# Patient Record
Sex: Female | Born: 1970
Health system: Southern US, Community
[De-identification: ages and names within clinical notes are randomized; demographics above are authoritative.]

## PROBLEM LIST (undated history)

## (undated) DIAGNOSIS — I10 Essential (primary) hypertension: Secondary | ICD-10-CM

## (undated) DIAGNOSIS — E119 Type 2 diabetes mellitus without complications: Secondary | ICD-10-CM

## (undated) DIAGNOSIS — E785 Hyperlipidemia, unspecified: Secondary | ICD-10-CM

## (undated) DIAGNOSIS — Z789 Other specified health status: Secondary | ICD-10-CM

## (undated) HISTORY — DX: Hyperlipidemia, unspecified: E78.5

## (undated) HISTORY — DX: Type 2 diabetes mellitus without complications: E11.9

## (undated) HISTORY — DX: Other specified health status: Z78.9

## (undated) HISTORY — DX: Essential (primary) hypertension: I10

## (undated) HISTORY — PX: NO PAST SURGERIES: SHX2092

---

## 2003-07-15 ENCOUNTER — Inpatient Hospital Stay (HOSPITAL_COMMUNITY): Admission: AD | Admit: 2003-07-15 | Discharge: 2003-07-16 | Payer: Self-pay | Admitting: Family Medicine

## 2003-07-16 ENCOUNTER — Encounter: Payer: Self-pay | Admitting: Family Medicine

## 2003-07-19 ENCOUNTER — Inpatient Hospital Stay (HOSPITAL_COMMUNITY): Admission: AD | Admit: 2003-07-19 | Discharge: 2003-07-19 | Payer: Self-pay | Admitting: Obstetrics and Gynecology

## 2004-05-24 ENCOUNTER — Other Ambulatory Visit: Admission: RE | Admit: 2004-05-24 | Discharge: 2004-05-24 | Payer: Self-pay | Admitting: Obstetrics and Gynecology

## 2004-06-28 ENCOUNTER — Ambulatory Visit (HOSPITAL_COMMUNITY): Admission: RE | Admit: 2004-06-28 | Discharge: 2004-06-28 | Payer: Self-pay | Admitting: Obstetrics and Gynecology

## 2005-03-21 ENCOUNTER — Ambulatory Visit: Payer: Self-pay | Admitting: *Deleted

## 2005-03-21 ENCOUNTER — Ambulatory Visit: Payer: Self-pay | Admitting: Nurse Practitioner

## 2005-06-07 ENCOUNTER — Ambulatory Visit: Payer: Self-pay | Admitting: Nurse Practitioner

## 2005-06-20 ENCOUNTER — Ambulatory Visit: Payer: Self-pay | Admitting: Nurse Practitioner

## 2005-06-26 ENCOUNTER — Ambulatory Visit (HOSPITAL_COMMUNITY): Admission: RE | Admit: 2005-06-26 | Discharge: 2005-06-26 | Payer: Self-pay | Admitting: Internal Medicine

## 2005-08-10 ENCOUNTER — Ambulatory Visit: Payer: Self-pay | Admitting: Family Medicine

## 2005-09-27 ENCOUNTER — Ambulatory Visit: Payer: Self-pay | Admitting: Nurse Practitioner

## 2005-11-13 ENCOUNTER — Ambulatory Visit: Payer: Self-pay | Admitting: Family Medicine

## 2006-11-25 ENCOUNTER — Ambulatory Visit (HOSPITAL_COMMUNITY): Admission: RE | Admit: 2006-11-25 | Discharge: 2006-11-25 | Payer: Self-pay | Admitting: Obstetrics & Gynecology

## 2012-06-01 ENCOUNTER — Ambulatory Visit: Payer: Self-pay | Admitting: Family Medicine

## 2012-06-01 VITALS — BP 131/76 | HR 114 | Temp 103.0°F | Resp 18 | Ht 66.5 in | Wt 148.0 lb

## 2012-06-01 DIAGNOSIS — R300 Dysuria: Secondary | ICD-10-CM

## 2012-06-01 DIAGNOSIS — R509 Fever, unspecified: Secondary | ICD-10-CM

## 2012-06-01 DIAGNOSIS — R112 Nausea with vomiting, unspecified: Secondary | ICD-10-CM

## 2012-06-01 DIAGNOSIS — R159 Full incontinence of feces: Secondary | ICD-10-CM

## 2012-06-01 DIAGNOSIS — N390 Urinary tract infection, site not specified: Secondary | ICD-10-CM

## 2012-06-01 LAB — POCT URINALYSIS DIPSTICK
Glucose, UA: NEGATIVE
Nitrite, UA: POSITIVE
Protein, UA: 100
Spec Grav, UA: 1.02
Urobilinogen, UA: 4
pH, UA: 6

## 2012-06-01 LAB — POCT CBC
Granulocyte percent: 75.5 % (ref 37–80)
HCT, POC: 40.9 % (ref 37.7–47.9)
Hemoglobin: 13.1 g/dL (ref 12.2–16.2)
Lymph, poc: 1.9 (ref 0.6–3.4)
MCH, POC: 26.7 pg — AB (ref 27–31.2)
MCHC: 32 g/dL (ref 31.8–35.4)
MCV: 83.4 fL (ref 80–97)
MID (cbc): 0.7 (ref 0–0.9)
MPV: 8.8 fL (ref 0–99.8)
POC Granulocyte: 7.9 — AB (ref 2–6.9)
POC LYMPH PERCENT: 18 %L (ref 10–50)
POC MID %: 6.5 %M (ref 0–12)
Platelet Count, POC: 251 10*3/uL (ref 142–424)
RBC: 4.9 M/uL (ref 4.04–5.48)
RDW, POC: 14.7 %
WBC: 10.4 10*3/uL — AB (ref 4.6–10.2)

## 2012-06-01 LAB — POCT UA - MICROSCOPIC ONLY
Casts, Ur, LPF, POC: NEGATIVE
Crystals, Ur, HPF, POC: NEGATIVE
Mucus, UA: NEGATIVE
Yeast, UA: NEGATIVE

## 2012-06-01 MED ORDER — PROMETHAZINE HCL 12.5 MG PO TABS: 12.5000 mg | ORAL_TABLET | Freq: Three times a day (TID) | ORAL | Status: DC | PRN

## 2012-06-01 MED ORDER — CIPROFLOXACIN HCL 500 MG PO TABS: 500.0000 mg | ORAL_TABLET | Freq: Two times a day (BID) | ORAL | Status: AC

## 2012-06-01 MED ORDER — CEFTRIAXONE SODIUM 1 G IJ SOLR
1.0000 g | Freq: Once | INTRAMUSCULAR | Status: AC
  Administered 2012-06-01: 1 g via INTRAMUSCULAR

## 2012-06-01 NOTE — Progress Notes (Signed)
[facility]:  Office Visit  Chief Complaint:  Chief Complaint  Patient presents with  . Fever    2 days  . Flank Pain    2 days     HPI: Crystal Doyle is a 41 y.o. female who complains of :  Right sided abdominal and flank pain with fever, nausea, and  Dysuria. + vomitus x 3. Denies hematuria. Has not tried any medications. No relieving factors.   History reviewed. No pertinent past medical history. History reviewed. No pertinent past surgical history. History   Social History  . Marital Status: Married    Spouse Name: N/A    Number of Children: N/A  . Years of Education: N/A   Social History Main Topics  . Smoking status: Never Smoker   . Smokeless tobacco: None  . Alcohol Use: No  . Drug Use: No  . Sexually Active: None   Other Topics Concern  . None   Social History Narrative  . None   Family History  Problem Relation Age of Onset  . Kidney disease Neg Hx    No Known Allergies Prior to Admission medications   Not on File     ROS: The patient denies night sweats, unintentional weight loss, chest pain, palpitations, wheezing, dyspnea on exertion,  hematuria, melena, numbness, weakness, or tingling.  +fevers, chills,  nausea, vomiting, abdominal pain, dysuria,  All other systems have been reviewed and were otherwise negative with the exception of those mentioned in the HPI and as above.    PHYSICAL EXAM: Filed Vitals:   06/01/12 1635  BP: 131/76  Pulse: 114  Temp: 103 F (39.4 C)  Resp: 18   Filed Vitals:   06/01/12 1635  Height: 5' 6.5" (1.689 m)  Weight: 148 lb (67.132 kg)   Body mass index is 23.53 kg/(m^2).  General: Alert, mild  acute distress HEENT:  Normocephalic, atraumatic, oropharynx patent.  Cardiovascular:  Regular rate and rhythm, no rubs murmurs or gallops.  No Carotid bruits, radial pulse intact. No pedal edema.  Respiratory: Clear to auscultation bilaterally.  No wheezes, rales, or rhonchi.  No  cyanosis, no use of accessory musculature GI: No organomegaly, abdomen is soft and non-tender, positive bowel sounds.  No masses. Skin: No rashes. Neurologic: Facial musculature symmetric. Psychiatric: Patient is appropriate throughout our interaction. Lymphatic: No cervical lymphadenopathy Musculoskeletal: Gait intact.+ right sided CVA tenderness   LABS: Results for orders placed in visit on 06/01/12  POCT CBC      Component Value Range   WBC 10.4 (*) 4.6 - 10.2 K/uL   Lymph, poc 1.9  0.6 - 3.4   POC LYMPH PERCENT 18.0  10 - 50 %L   MID (cbc) 0.7  0 - 0.9   POC MID % 6.5  0 - 12 %M   POC Granulocyte 7.9 (*) 2 - 6.9   Granulocyte percent 75.5  37 - 80 %G   RBC 4.90  4.04 - 5.48 M/uL   Hemoglobin 13.1  12.2 - 16.2 g/dL   HCT, POC 16.1  09.6 - 47.9 %   MCV 83.4  80 - 97 fL   MCH, POC 26.7 (*) 27 - 31.2 pg   MCHC 32.0  31.8 - 35.4 g/dL   RDW, POC 04.5     Platelet Count, POC 251  142 - 424 K/uL   MPV 8.8  0 - 99.8 fL  POCT UA - MICROSCOPIC ONLY  Component Value Range   WBC, Ur, HPF, POC TNTC     RBC, urine, microscopic 5-10     Bacteria, U Microscopic 1+ bacilli     Mucus, UA neg     Epithelial cells, urine per micros 0-2     Crystals, Ur, HPF, POC neg     Casts, Ur, LPF, POC neg     Yeast, UA neg    POCT URINALYSIS DIPSTICK      Component Value Range   Color, UA amber     Clarity, UA cloudy     Glucose, UA neg     Bilirubin, UA small     Ketones, UA trace     Spec Grav, UA 1.020     Blood, UA mod     pH, UA 6.0     Protein, UA 100     Urobilinogen, UA 4.0     Nitrite, UA pos     Leukocytes, UA moderate (2+)       EKG/XRAY:   Primary read interpreted by Dr. Conley Rolls at [facility].   ASSESSMENT/PLAN: Encounter Diagnoses  Name Primary?  . Fever Yes  . Dysuria   . Nausea & vomiting   . Urinary tract infection, site not specified     UTI/Pyelonephritis-rx Rocephin 1 gram Im x 1, Cipro 500 mg PO BID x 10 days Nausea-Promethazine prn Fever- Tylenol prn Push  fluids at home F/u prn if no improvement in next 48-72 hrs.    LE, THAO PHUONG, DO 06/01/2012 5:11 PM

## 2012-06-03 LAB — URINE CULTURE: Colony Count: >=100000

## 2013-11-14 ENCOUNTER — Ambulatory Visit: Payer: Self-pay | Admitting: Family Medicine

## 2013-11-14 DIAGNOSIS — J069 Acute upper respiratory infection, unspecified: Secondary | ICD-10-CM

## 2013-11-14 DIAGNOSIS — Z23 Encounter for immunization: Secondary | ICD-10-CM

## 2013-11-14 MED ORDER — INFLUENZA VAC SPLIT QUAD 0.5 ML IM SUSP: 0.5000 mL | INTRAMUSCULAR | Status: DC

## 2013-11-14 MED ORDER — HYDROCODONE-HOMATROPINE 5-1.5 MG/5ML PO SYRP: 5.0000 mL | ORAL_SOLUTION | Freq: Three times a day (TID) | ORAL | Status: DC | PRN

## 2013-11-14 MED ORDER — AZITHROMYCIN 250 MG PO TABS: ORAL_TABLET | ORAL | Status: DC

## 2013-11-14 NOTE — Progress Notes (Signed)
    This chart was scribed for Elvina Sidle, MD by Arlan Organ, ED Scribe. This patient was seen in room Room 11d and the patient's care was started 12:34 PM.   Patient Name: Crystal Doyle Date of Birth: 01-28-1971 Medical Record Number: [id number] Gender: female Date of Encounter: 11/14/2013  Chief Complaint: Dizziness and Fever  History of Present Illness:  Crystal Doyle is a 42 y.o. very pleasant female patient who presents with the following:  Pt presents to [facility] c/o of gradual onset, unchanged dizziness that initially started 2 days ago. She also lists nasal congestion, mild cough, and fever as associated symptoms. Pt states her fever has now resolved. Pt has tried OTC Tylenol and Nyquil with no relief. Her husband states she was at work the other day, and says they left the door open to the facility allowing cold air to come in. She denies any other pertinent medical conditions.  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  History reviewed. No pertinent past medical history. History reviewed. No pertinent past surgical history. History  Substance Use Topics  . Smoking status: Never Smoker   . Smokeless tobacco: Not on file  . Alcohol Use: No   Family History  Problem Relation Age of Onset  . Kidney disease Neg Hx    No Known Allergies  Medication list has been reviewed and updated.  Current Outpatient Prescriptions on File Prior to Visit  Medication Sig Dispense Refill  . promethazine (PHENERGAN) 12.5 MG tablet Take 1 tablet (12.5 mg total) by mouth every 8 (eight) hours as needed for nausea.  20 tablet  0   No current facility-administered medications on file prior to visit.    Review of Systems:   Physical Examination: There were no vitals filed for this visit. @vitals2 @ There is no weight on file to calculate BMI. Ideal Body Weight: @FLOWAMB ([id number])@ HEENT: Mucopurulent discharge in  nose No acute distress Neck: Supple no adenopathy Chest: Clear Heart: Regular no murmur Skin no rash   EKG / Labs / Xrays: None available at time of encounter  Assessment and Plan:  URI (upper respiratory infection) - Plan: azithromycin (ZITHROMAX Z-PAK) 250 MG tablet, HYDROcodone-homatropine (HYCODAN) 5-1.5 MG/5ML syrup  Need for prophylactic vaccination and inoculation against influenza - Plan: influenza vac split quadrivalent PF (FLUARIX) injection 0.5 mL  Signed, Elvina Sidle, MD

## 2013-11-14 NOTE — Patient Instructions (Signed)

## 2014-02-06 ENCOUNTER — Ambulatory Visit (INDEPENDENT_AMBULATORY_CARE_PROVIDER_SITE_OTHER): Payer: PRIVATE HEALTH INSURANCE | Admitting: Physician Assistant

## 2014-02-06 VITALS — BP 112/60 | HR 76 | Temp 98.6°F | Resp 18 | Ht 66.5 in | Wt 149.0 lb

## 2014-02-06 DIAGNOSIS — J329 Chronic sinusitis, unspecified: Secondary | ICD-10-CM

## 2014-02-06 DIAGNOSIS — N926 Irregular menstruation, unspecified: Secondary | ICD-10-CM

## 2014-02-06 DIAGNOSIS — R05 Cough: Secondary | ICD-10-CM

## 2014-02-06 DIAGNOSIS — R059 Cough, unspecified: Secondary | ICD-10-CM

## 2014-02-06 MED ORDER — AMOXICILLIN 875 MG PO TABS: 1750.0000 mg | ORAL_TABLET | Freq: Two times a day (BID) | ORAL | Status: DC

## 2014-02-06 MED ORDER — HYDROCOD POLST-CHLORPHEN POLST 10-8 MG/5ML PO LQCR: 5.0000 mL | Freq: Two times a day (BID) | ORAL | Status: DC | PRN

## 2014-02-06 MED ORDER — IPRATROPIUM BROMIDE 0.03 % NA SOLN: 2.0000 | Freq: Two times a day (BID) | NASAL | Status: DC

## 2014-02-06 MED ORDER — GUAIFENESIN ER 1200 MG PO TB12: 1.0000 | ORAL_TABLET | Freq: Two times a day (BID) | ORAL | Status: DC | PRN

## 2014-02-06 NOTE — Patient Instructions (Addendum)
Get plenty of rest and drink at least 64 ounces of water daily.  If you have not heard anything regarding the referral in 1 week, please contact our office.

## 2014-02-06 NOTE — Progress Notes (Signed)
   Subjective:    Patient ID: Crystal Doyle, female    DOB: 12-09-1970, 43 y.o.   MRN: [id number]   PCP: No primary provider on file.  Chief Complaint  Patient presents with  . Cough    x1 week   . Headache  . Nasal Congestion    Medications, allergies, past medical history, surgical history, family history, social history and problem list reviewed and updated.  HPI  She is coughing "severely." "I don't know if it's the flu," says her husband, who helps translate. HA, sneezing, nasal congestion. Sore throat. Cough is productive, though she cannot cough anything up that she can spit out, but she can get choked on the phlegm. When that happens, her eyes water. Subjective fever/chills.  Acetaminophen helps. NyQuil helped her sleep.  No ear pain/pressure. No nausea,vomiting, diarrhea.  No unexplained muscle aches.  Review of Systems As above. She notes long-standing irregular menses, often 5 months between.  She has never been pregnant, and would like to be.    Objective:   Physical Exam  Vitals reviewed. Constitutional: She is oriented to person, place, and time. Vital signs are normal. She appears well-developed and well-nourished. She is active and cooperative. No distress.  BP 112/60  Pulse 76  Temp(Src) 98.6 F (37 C) (Oral)  Resp 18  Ht 5' 6.5" (1.689 m)  Wt 149 lb (67.586 kg)  BMI 23.69 kg/m2  SpO2 99%  LMP 11/02/2013   HENT:  Head: Normocephalic and atraumatic.  Right Ear: Hearing, tympanic membrane, external ear and ear canal normal.  Left Ear: Hearing, tympanic membrane, external ear and ear canal normal.  Nose: Mucosal edema and rhinorrhea present. Right sinus exhibits maxillary sinus tenderness. Right sinus exhibits no frontal sinus tenderness. Left sinus exhibits maxillary sinus tenderness. Left sinus exhibits no frontal sinus tenderness.  Mouth/Throat: Uvula is midline and oropharynx is clear and moist. No oropharyngeal exudate.  Eyes: Conjunctivae and EOM  are normal. Pupils are equal, round, and reactive to light. No scleral icterus.  Neck: Normal range of motion. Neck supple.  Cardiovascular: Normal rate, regular rhythm and normal heart sounds.   Pulmonary/Chest: Effort normal and breath sounds normal.  Neurological: She is alert and oriented to person, place, and time.  Skin: Skin is warm and dry.  Psychiatric: She has a normal mood and affect. Her behavior is normal.          Assessment & Plan:  1. Cough - chlorpheniramine-HYDROcodone (TUSSIONEX PENNKINETIC ER) 10-8 MG/5ML LQCR; Take 5 mLs by mouth every 12 (twelve) hours as needed for cough (cough).  Dispense: 100 mL; Refill: 0  2. Sinusitis Rest, fluids; anticipatory guidance. - amoxicillin (AMOXIL) 875 MG tablet; Take 2 tablets (1,750 mg total) by mouth 2 (two) times daily.  Dispense: 20 tablet; Refill: 0 - ipratropium (ATROVENT) 0.03 % nasal spray; Place 2 sprays into both nostrils 2 (two) times daily.  Dispense: 30 mL; Refill: 0 - Guaifenesin (MUCINEX MAXIMUM STRENGTH) 1200 MG TB12; Take 1 tablet (1,200 mg total) by mouth every 12 (twelve) hours as needed.  Dispense: 14 tablet; Refill: 1  3. Irregular menses - Ambulatory referral to Obstetrics / Gynecology   Fernande Brashelle S. Kandi Brusseau, PA-C Physician Assistant-Certified [facility]

## 2014-02-09 ENCOUNTER — Encounter: Payer: Self-pay | Admitting: Family Medicine

## 2014-02-16 ENCOUNTER — Ambulatory Visit (INDEPENDENT_AMBULATORY_CARE_PROVIDER_SITE_OTHER): Payer: PRIVATE HEALTH INSURANCE | Admitting: Family Medicine

## 2014-02-16 ENCOUNTER — Ambulatory Visit: Payer: PRIVATE HEALTH INSURANCE

## 2014-02-16 VITALS — BP 98/80 | HR 93 | Temp 98.5°F | Resp 20 | Ht 67.5 in | Wt 148.0 lb

## 2014-02-16 DIAGNOSIS — J209 Acute bronchitis, unspecified: Secondary | ICD-10-CM

## 2014-02-16 DIAGNOSIS — R05 Cough: Secondary | ICD-10-CM

## 2014-02-16 DIAGNOSIS — R059 Cough, unspecified: Secondary | ICD-10-CM

## 2014-02-16 MED ORDER — LEVOFLOXACIN 500 MG PO TABS: 500.0000 mg | ORAL_TABLET | Freq: Every day | ORAL | Status: DC

## 2014-02-16 MED ORDER — PREDNISONE 20 MG PO TABS: 40.0000 mg | ORAL_TABLET | Freq: Every day | ORAL | Status: DC

## 2014-02-16 NOTE — Progress Notes (Addendum)
Subjective:  This chart was scribed for Elvina SidleKurt Lauenstein, MD by Carl Bestelina Holson, Medical Scribe. This patient was seen in Room 1 and the patient's care was started at 5:18 PM.   Patient ID: Crystal Doyle, female    DOB: [birth year], 43 y.o.   MRN: [id number]  HPI HPI Comments: Crystal Doyle is a 43 y.o. female who presents to the [facility] complaining of a constant cough and fatigue that started 10 days ago.  The patient's husband states that [facility] onset of her symptoms and was discharged with antibiotics and cough medicine.  He states that the patient is still coughing and the medication did not help.  He states that she did not have a chest x-ray when she presented to [facility] last time.  The patient lists intermittent vomiting as an associated symptom.  She denies having a history of Asthma.  The patient states that she is working at [employer].  The patient is originally from [place].    History reviewed. No pertinent past medical history. History reviewed. No pertinent past surgical history. Family History  Problem Relation Age of Onset   Kidney disease Neg Hx    Diabetes Brother    History   Social History   Marital Status: Married    Spouse Name: Adil    Number of Children: 0   Years of Education: college   Occupational History   Ticketing     [employer]   Social History Main Topics   Smoking status: Never Smoker    Smokeless tobacco: Never Used   Alcohol Use: No   Drug Use: No   Sexual Activity: Not on file   Other Topics Concern   Not on file   Social History Narrative   From [place].  [place] the [place] in [place]. Lives with her husband.   No Known Allergies  Review of Systems  Constitutional: Positive for fatigue.  Respiratory: Positive for cough.   Gastrointestinal: Positive for vomiting.  All other systems reviewed and are negative.     Objective:  Physical Exam Pulmonary: Expiratory wheezes bilaterally but worse on the right.   Expiratory wheezes in the front.   HEENT:  Neg  [facility] reading (PRIMARY) by  Dr. Milus GlazierLauenstein:  CXR:  Heavy bronchial markings  Results for orders placed in visit on 06/01/12  URINE CULTURE      Result Value Ref Range   Culture ESCHERICHIA COLI     Colony Count >=100,000 COLONIES/ML     Organism ID, Bacteria ESCHERICHIA COLI    POCT CBC      Result Value Ref Range   WBC 10.4 (*) 4.6 - 10.2 K/uL   Lymph, poc 1.9  0.6 - 3.4   POC LYMPH PERCENT 18.0  10 - 50 %L   MID (cbc) 0.7  0 - 0.9   POC MID % 6.5  0 - 12 %M   POC Granulocyte 7.9 (*) 2 - 6.9   Granulocyte percent 75.5  37 - 80 %G   RBC 4.90  4.04 - 5.48 M/uL   Hemoglobin 13.1  12.2 - 16.2 g/dL   HCT, POC 62.940.9  52.837.7 - 47.9 %   MCV 83.4  80 - 97 fL   MCH, POC 26.7 (*) 27 - 31.2 pg   MCHC 32.0  31.8 - 35.4 g/dL   RDW, POC 41.314.7     Platelet Count, POC 251  142 - 424 K/uL   MPV 8.8  0 - 99.8 fL  POCT UA - MICROSCOPIC ONLY      Result Value Ref Range   WBC, Ur, HPF, POC TNTC     RBC, urine, microscopic 5-10     Bacteria, U Microscopic 1+ bacilli     Mucus, UA neg     Epithelial cells, urine per micros 0-2     Crystals, Ur, HPF, POC neg     Casts, Ur, LPF, POC neg     Yeast, UA neg    POCT URINALYSIS DIPSTICK      Result Value Ref Range   Color, UA amber     Clarity, UA cloudy     Glucose, UA neg     Bilirubin, UA small     Ketones, UA trace     Spec Grav, UA 1.020     Blood, UA mod     pH, UA 6.0     Protein, UA 100     Urobilinogen, UA 4.0     Nitrite, UA pos     Leukocytes, UA moderate (2+)        BP 98/80   Pulse 93   Temp(Src) 98.5 F (36.9 C)   Resp 20   Ht 5' 7.5" (1.715 m)   Wt 148 lb (67.132 kg)   BMI 22.82 kg/m2   SpO2 97%   LMP 11/02/2013 Assessment & Plan:   Cough - Plan: DG Chest 2 View, POCT CBC, DG Chest 2 View, predniSONE (DELTASONE) 20 MG tablet, levofloxacin (LEVAQUIN) 500 MG tablet  Acute bronchitis - Plan: predniSONE (DELTASONE) 20 MG tablet, levofloxacin (LEVAQUIN) 500 MG  tablet  Signed, Elvina Sidle, MD  g I personally performed the services described in this documentation, which was scribed in my presence. The recorded information has been reviewed and is accurate.

## 2014-03-25 ENCOUNTER — Encounter: Payer: Self-pay | Admitting: Nurse Practitioner

## 2016-05-14 ENCOUNTER — Ambulatory Visit: Payer: Self-pay | Attending: Internal Medicine

## 2016-05-16 ENCOUNTER — Ambulatory Visit (INDEPENDENT_AMBULATORY_CARE_PROVIDER_SITE_OTHER): Payer: Self-pay | Admitting: Internal Medicine

## 2016-05-16 ENCOUNTER — Encounter: Payer: Self-pay | Admitting: Internal Medicine

## 2016-05-16 VITALS — BP 119/75 | HR 70 | Temp 98.4°F | Ht 66.0 in | Wt 145.2 lb

## 2016-05-16 DIAGNOSIS — H538 Other visual disturbances: Secondary | ICD-10-CM

## 2016-05-16 DIAGNOSIS — Z0000 Encounter for general adult medical examination without abnormal findings: Secondary | ICD-10-CM | POA: Insufficient documentation

## 2016-05-16 LAB — POCT GLYCOSYLATED HEMOGLOBIN (HGB A1C): Hemoglobin A1C: 6

## 2016-05-16 LAB — GLUCOSE, CAPILLARY: GLUCOSE-CAPILLARY: 81 mg/dL (ref 65–99)

## 2016-05-16 NOTE — Assessment & Plan Note (Signed)
Needs pap smear, discussed, due to religious preferences she would prefer a female physician preform this, will defer for now.

## 2016-05-16 NOTE — Patient Instructions (Signed)
General Instructions:  Please complete the paper work for the orange card. I will call with the test results  Please bring your medicines with you each time you come to clinic.  Medicines may include prescription medications, over-the-counter medications, herbal remedies, eye drops, vitamins, or other pills.   Progress Toward Treatment Goals:  No flowsheet data found.  Self Care Goals & Plans:  No flowsheet data found.  No flowsheet data found.   Care Management & Community Referrals:  No flowsheet data found.

## 2016-05-16 NOTE — Progress Notes (Signed)
[facility] Subjective:   Patient ID: Crystal Doyle female   DOB: 30-Sep-1971 45 y.o.   MRN: [id number]  HPI: Crystal Doyle is a 45 y.o. female no PMH [facility] with CC of blurry vision to establish care. She is from [place] originally [place].  She is here today with her husband.  She currently works for [employer].   She reports she has noticed a slight change in her vision over the last few years.  She wants to have her eyes checked.  She has never had vision problems before.  She denies any pain in her eyes, headaches, fever or chills. She denies any polyuria or polydipsia.  She also feels she is overdue for a dental cleaning but denies any acute dental complaints.  She takes no medications.    No past medical history on file. No current outpatient prescriptions on file.   No current facility-administered medications for this visit.   Family History  Problem Relation Age of Onset  . Kidney disease Neg Hx   . Diabetes Brother    Social History   Social History  . Marital Status: Married    Spouse Name: Adil  . Number of Children: 0  . Years of Education: college   Occupational History  . Ticketing     [employer]   Social History Main Topics  . Smoking status: Never Smoker   . Smokeless tobacco: Never Used  . Alcohol Use: No  . Drug Use: No  . Sexual Activity: Not Asked   Other Topics Concern  . None   Social History Narrative   From [place].  [place]. Lives with her husband. Completed High school education in [place]   Review of Systems: Review of Systems  Constitutional: Negative for fever, chills and malaise/fatigue.  Eyes: Positive for blurred vision. Negative for double vision, photophobia, pain and discharge.  Gastrointestinal: Negative for heartburn.  Genitourinary: Negative for dysuria.  Neurological: Negative for headaches.  Endo/Heme/Allergies: Negative for polydipsia.     Objective:   Physical Exam: Filed Vitals:   05/16/16 1536 05/16/16 1537  BP:  119/75  Pulse:  70  Temp: 98.4 F (36.9 C)   TempSrc: Oral   Height: 5\' 6"  (1.676 m)   Weight: 145 lb 3.2 oz (65.862 kg)   SpO2:  100%  Physical Exam  Constitutional: She is well-developed, well-nourished, and in no distress.  HENT:  Mouth/Throat: Oropharynx is clear and moist.  Eyes: Conjunctivae and EOM are normal. Pupils are equal, round, and reactive to light. Right eye exhibits no discharge. Left eye exhibits no discharge. No scleral icterus.  Cardiovascular: Normal rate, regular rhythm and normal heart sounds.   Pulmonary/Chest: Effort normal and breath sounds normal.  Nursing note and vitals reviewed.   Assessment & Plan:  Case discussed with Dr. Elson AreasButcher  Blurry vision, bilateral She is currently applying for orange card, she would like to hold off on referral to optometry until she has completed that paperwork. Will check BMP and A1c for diabetes  Preventative health care Needs pap smear, discussed, due to religious preferences she would prefer a female physician preform this, will defer for now.    Medications Ordered No orders of the defined types were placed in this encounter.   Other Orders Orders Placed This Encounter  Procedures  . BMP8+Anion Gap  . Glucose, capillary  . POC Hbg A1C   Follow Up: Return  in about 2 months (around 07/16/2016).

## 2016-05-16 NOTE — Assessment & Plan Note (Signed)
She is currently applying for orange card, she would like to hold off on referral to optometry until she has completed that paperwork. Will check BMP and A1c for diabetes

## 2016-05-17 LAB — BMP8+ANION GAP
ANION GAP: 11 mmol/L (ref 10.0–18.0)
BUN/Creatinine Ratio: 18 (ref 9–23)
BUN: 12 mg/dL (ref 6–24)
CALCIUM: 10.4 mg/dL — AB (ref 8.7–10.2)
CO2: 29 mmol/L (ref 18–29)
CREATININE: 0.67 mg/dL (ref 0.57–1.00)
Chloride: 100 mmol/L (ref 96–106)
GFR calc Af Amer: 121 mL/min/{1.73_m2} (ref >59)
GFR calc non Af Amer: 105 mL/min/{1.73_m2} (ref >59)
GLUCOSE: 86 mg/dL (ref 65–99)
Potassium: 4.3 mmol/L (ref 3.5–5.2)
Sodium: 140 mmol/L (ref 134–144)

## 2016-05-18 NOTE — Progress Notes (Signed)
Internal Medicine Clinic Attending  Case discussed with Dr. Hoffman soon after the resident saw the patient.  We reviewed the resident's history and exam and pertinent patient test results.  I agree with the assessment, diagnosis, and plan of care documented in the resident's note. 

## 2018-01-22 ENCOUNTER — Encounter: Payer: Self-pay | Admitting: Internal Medicine

## 2018-12-09 ENCOUNTER — Encounter: Payer: Self-pay | Admitting: Internal Medicine

## 2019-01-12 ENCOUNTER — Ambulatory Visit: Payer: Self-pay | Attending: Nurse Practitioner | Admitting: Nurse Practitioner

## 2019-01-12 ENCOUNTER — Encounter: Payer: Self-pay | Admitting: Nurse Practitioner

## 2019-01-12 VITALS — BP 127/88 | HR 81 | Temp 98.9°F | Ht 67.0 in | Wt 147.8 lb

## 2019-01-12 DIAGNOSIS — R7303 Prediabetes: Secondary | ICD-10-CM

## 2019-01-12 DIAGNOSIS — M79645 Pain in left finger(s): Principal | ICD-10-CM

## 2019-01-12 DIAGNOSIS — Z0000 Encounter for general adult medical examination without abnormal findings: Secondary | ICD-10-CM

## 2019-01-12 DIAGNOSIS — M79644 Pain in right finger(s): Secondary | ICD-10-CM

## 2019-01-12 LAB — POCT GLYCOSYLATED HEMOGLOBIN (HGB A1C): Hemoglobin A1C: 6 % — AB (ref 4.0–5.6)

## 2019-01-12 LAB — GLUCOSE, POCT (MANUAL RESULT ENTRY): POC Glucose: 93 mg/dl (ref 70–99)

## 2019-01-12 MED ORDER — DICLOFENAC SODIUM 1 % TD GEL: 2.0000 g | Freq: Four times a day (QID) | TRANSDERMAL | 1 refills | Status: AC

## 2019-01-12 NOTE — Progress Notes (Signed)
Assessment & Plan:  Crystal Doyle was seen today for new patient (initial visit).  Diagnoses and all orders for this visit:  Pain in finger of both hands -     diclofenac sodium (VOLTAREN) 1 % GEL; Apply 2 g topically 4 (four) times daily for 30 days.  Prediabetes -     Glucose (CBG) -     HgB A1c -     CBC -     CMP14+EGFR -     Lipid panel Continue blood sugar control as discussed in office today, low carbohydrate diet, and regular physical exercise as tolerated, 150 minutes per week (30 min each day, 5 days per week, or 50 min 3 days per week).    Routine adult health maintenance -     VITAMIN D 25 Hydroxy (Vit-D Deficiency, Fractures)    Patient has been counseled on age-appropriate routine health concerns for screening and prevention. These are reviewed and up-to-date. Referrals have been placed accordingly. Immunizations are up-to-date or declined.    Subjective:   Chief Complaint  Patient presents with  . New Patient (Initial Visit)    Pt. is here to establish care. Pt. stated she have pain on her fingers.   HPI Crystal Doyle 48 y.o. female presents to office today to establish care. She is accompanied by her husband today.   Hand Pain She works at [employer] for the past 6 years and over the past few months begin to experience pain in the bilateral trapezoid bones of the hand.  She takes tylenol for pain which provides relief of symptoms. Pain is worse with repetitive motion. She denies any joint swelling, erythema or injury.  Prediabetes Well controlled. She denies any hypo or hyperglycemic symptoms. Currently diet controlled.  Lab Results  Component Value Date   HGBA1C 6.0 (A) 01/12/2019   Review of Systems  Constitutional: Negative for fever, malaise/fatigue and weight loss.  HENT: Negative.  Negative for nosebleeds.   Eyes: Negative.  Negative for blurred vision, double vision and photophobia.  Respiratory: Negative.  Negative for cough and shortness of breath.     Cardiovascular: Negative.  Negative for chest pain, palpitations and leg swelling.  Gastrointestinal: Negative.  Negative for heartburn, nausea and vomiting.  Musculoskeletal: Positive for joint pain. Negative for myalgias.  Neurological: Negative.  Negative for dizziness, focal weakness, seizures and headaches.  Psychiatric/Behavioral: Negative.  Negative for suicidal ideas.    History reviewed. No pertinent past medical history.  History reviewed. No pertinent surgical history.  Family History  Problem Relation Age of Onset  . Diabetes Brother   . Kidney disease Neg Hx     Social History Reviewed with no changes to be made today.   No outpatient medications prior to visit.   No facility-administered medications prior to visit.     No Known Allergies     Objective:    BP 127/88 (BP Location: Right Arm, Patient Position: Sitting, Cuff Size: Normal)   Pulse 81   Temp 98.9 F (37.2 C) (Oral)   Ht 5' 7" (1.702 m)   Wt 147 lb 12.8 oz (67 kg)   LMP 11/02/2013 Comment: lifelong irregular menses  SpO2 98%   BMI 23.15 kg/m  Wt Readings from Last 3 Encounters:  01/12/19 147 lb 12.8 oz (67 kg)  05/16/16 145 lb 3.2 oz (65.9 kg)  02/16/14 148 lb (67.1 kg)    Physical Exam Vitals signs and nursing note reviewed.  Constitutional:      Appearance: She  is well-developed.  HENT:     Head: Normocephalic and atraumatic.  Neck:     Musculoskeletal: Normal range of motion.  Cardiovascular:     Rate and Rhythm: Normal rate and regular rhythm.     Heart sounds: Normal heart sounds. No murmur. No friction rub. No gallop.   Pulmonary:     Effort: Pulmonary effort is normal. No tachypnea or respiratory distress.     Breath sounds: Normal breath sounds. No decreased breath sounds, wheezing, rhonchi or rales.  Chest:     Chest wall: No tenderness.  Abdominal:     General: Bowel sounds are normal.     Palpations: Abdomen is soft.  Musculoskeletal: Normal range of motion.      Right hand: She exhibits tenderness. She exhibits normal range of motion and no bony tenderness. Normal sensation noted. Normal strength noted.     Left hand: She exhibits tenderness. She exhibits normal range of motion and no bony tenderness. Normal sensation noted. Normal strength noted.       Hands:  Skin:    General: Skin is warm and dry.  Neurological:     Mental Status: She is alert and oriented to person, place, and time.     Coordination: Coordination normal.  Psychiatric:        Behavior: Behavior normal. Behavior is cooperative.        Thought Content: Thought content normal.        Judgment: Judgment normal.        Patient has been counseled extensively about nutrition and exercise as well as the importance of adherence with medications and regular follow-up. The patient was given clear instructions to go to ER or return to medical center if symptoms don't improve, worsen or new problems develop. The patient verbalized understanding.   Follow-up: Return in about 4 weeks (around 02/09/2019) for bilateral finger pain; Make appointment for a Monday Appointment. Gildardo Pounds, FNP-BC [facility], [place]   01/12/2019, 3:17 PM

## 2019-01-12 NOTE — Patient Instructions (Signed)
You can try Biotin daily for hair, skin, nails You can also try a B complex for increased energy

## 2019-01-13 LAB — VITAMIN D 25 HYDROXY (VIT D DEFICIENCY, FRACTURES): Vit D, 25-Hydroxy: 22.2 ng/mL — ABNORMAL LOW (ref 30.0–100.0)

## 2019-01-13 LAB — CMP14+EGFR
ALK PHOS: 141 IU/L — AB (ref 39–117)
ALT: 15 IU/L (ref 0–32)
AST: 19 IU/L (ref 0–40)
Albumin/Globulin Ratio: 1.5 (ref 1.2–2.2)
Albumin: 4.7 g/dL (ref 3.8–4.8)
BUN / CREAT RATIO: 25 — AB (ref 9–23)
BUN: 15 mg/dL (ref 6–24)
CHLORIDE: 98 mmol/L (ref 96–106)
CO2: 28 mmol/L (ref 20–29)
Calcium: 11.4 mg/dL — ABNORMAL HIGH (ref 8.7–10.2)
Creatinine, Ser: 0.61 mg/dL (ref 0.57–1.00)
GFR calc Af Amer: 123 mL/min/{1.73_m2} (ref >59)
GFR calc non Af Amer: 107 mL/min/{1.73_m2} (ref >59)
GLUCOSE: 92 mg/dL (ref 65–99)
Globulin, Total: 3.2 g/dL (ref 1.5–4.5)
Potassium: 4.4 mmol/L (ref 3.5–5.2)
Sodium: 140 mmol/L (ref 134–144)
Total Protein: 7.9 g/dL (ref 6.0–8.5)

## 2019-01-13 LAB — CBC
Hematocrit: 41.8 % (ref 34.0–46.6)
Hemoglobin: 14.2 g/dL (ref 11.1–15.9)
MCH: 28.3 pg (ref 26.6–33.0)
MCHC: 34 g/dL (ref 31.5–35.7)
MCV: 83 fL (ref 79–97)
PLATELETS: 250 10*3/uL (ref 150–450)
RBC: 5.02 x10E6/uL (ref 3.77–5.28)
RDW: 12.9 % (ref 11.7–15.4)
WBC: 5.5 10*3/uL (ref 3.4–10.8)

## 2019-01-13 LAB — LIPID PANEL
CHOLESTEROL TOTAL: 242 mg/dL — AB (ref 100–199)
Chol/HDL Ratio: 5.1 ratio — ABNORMAL HIGH (ref 0.0–4.4)
HDL: 47 mg/dL (ref >39)
LDL Calculated: 156 mg/dL — ABNORMAL HIGH (ref 0–99)
Triglycerides: 194 mg/dL — ABNORMAL HIGH (ref 0–149)
VLDL CHOLESTEROL CAL: 39 mg/dL (ref 5–40)

## 2019-01-14 ENCOUNTER — Other Ambulatory Visit: Payer: Self-pay | Admitting: Nurse Practitioner

## 2019-01-15 ENCOUNTER — Telehealth: Payer: Self-pay

## 2019-01-15 NOTE — Telephone Encounter (Signed)
CMA spoke to patient's husband and inform on results.  Pt's husband will inform patient the lab results and advising.

## 2019-01-15 NOTE — Telephone Encounter (Signed)
-----   Message from Claiborne Rigg, NP sent at 01/14/2019 12:02 AM EST ----- Vitamin D is slightly low. You can take over the counter vitamin D 2000 units daily. Your labs do not show anemia. Your calcium level is high. Will need to have it repeated as well as a few other blood tests. Please make a lab appt. Cholesterol levels are high. Make sure you are avoiding fatty foods, red meat, junk food, take out foods and unhealthy snacks.

## 2019-01-26 ENCOUNTER — Ambulatory Visit: Payer: Self-pay | Attending: Family Medicine

## 2019-02-16 ENCOUNTER — Ambulatory Visit: Payer: Self-pay | Attending: Nurse Practitioner | Admitting: Nurse Practitioner

## 2019-02-16 ENCOUNTER — Encounter: Payer: Self-pay | Admitting: Nurse Practitioner

## 2019-02-16 ENCOUNTER — Other Ambulatory Visit: Payer: Self-pay

## 2019-02-16 VITALS — BP 124/83 | HR 87 | Temp 98.2°F | Resp 18 | Ht 66.0 in | Wt 150.0 lb

## 2019-02-16 DIAGNOSIS — M25542 Pain in joints of left hand: Secondary | ICD-10-CM

## 2019-02-16 DIAGNOSIS — M25541 Pain in joints of right hand: Secondary | ICD-10-CM

## 2019-02-16 MED ORDER — ACETAMINOPHEN ER 650 MG PO TBCR: 650.0000 mg | EXTENDED_RELEASE_TABLET | Freq: Three times a day (TID) | ORAL | 1 refills | Status: AC | PRN

## 2019-02-16 NOTE — Patient Instructions (Signed)
Joint Pain  Joint pain (arthralgia) may be caused by many things. Joint pain is likely to go away when you follow instructions from your health care provider for relieving pain at home. However, joint pain can also be caused by conditions that require more treatment. Common causes of joint pain include:  Bruising in the area of the joint.  Injury caused by repeating certain movements too many times (overuse injury).  Age-related joint wear and tear (osteoarthritis).  Buildup of uric acid crystals in the joint (gout).  Inflammation of the joint (rheumatic disease).  Various other forms of arthritis.  Infections of the joint (septic arthritis) or of the bone (osteomyelitis).  Your health care provider may recommend that you take pain medicine or wear a supportive device like an elastic bandage, sling, or splint. If your joint pain continues, you may need lab or imaging tests to diagnose the cause of your joint pain.  Follow these instructions at home:  Managing pain, stiffness, and swelling    If directed, put ice on the painful area. Icing can help to relieve joint pain and swelling.  Put ice in a plastic bag.  Place a towel between your skin and the bag.  Leave the ice on for 20 minutes, 2-3 times a day.  If directed, apply heat to the painful area as often as told by your health care provider. Heat can reduce the stiffness of your muscles and joints. Use the heat source that your health care provider recommends, such as a moist heat pack or a heating pad.  Place a towel between your skin and the heat source.  Leave the heat on for 20-30 minutes.  Remove the heat if your skin turns bright red. This is especially important if you are unable to feel pain, heat, or cold. You may have a greater risk of getting burned.  Move your fingers or toes below the painful joint often. You can avoid stiffness and lessen swelling by doing this.  If possible, raise (elevate) the painful joint above the level of your heart while you  are sitting or lying down. To do this, try putting a few pillows under the painful joint.  Activity  Rest the painful joint for as long as directed. Do not do anything that causes or worsens pain.  Begin exercising or stretching the affected area, as told by your health care provider. Ask your health care provider what types of exercise are safe for you.  If you have an elastic bandage, sling, or splint:  Wear the supportive device as told by your health care provider. Remove it only as told by your health care provider.  Loosen the device if your fingers or toes below the joint tingle, become numb, or turn cold and blue.  Keep the device clean.   Ask your health care provider if you should remove the device before bathing. You may need to cover it with a watertight covering when you take a bath or a shower.  General instructions  Take over-the-counter and prescription medicines only as told by your health care provider.  Do not use any products that contain nicotine or tobacco, such as cigarettes and e-cigarettes. If you need help quitting, ask your health care provider.  Keep all follow-up visits as told by your health care provider. This is important.  Contact a health care provider if:  You have pain that gets worse and does not get better with medicine.  Your joint pain does not improve within   3 days.  You have increased bruising or swelling.  You have a fever.  You lose 10 lb (4.5 kg) or more without trying.  Get help right away if:  You cannot move the joint.  Your fingers or toes tingle, become numb, or turn cold and blue.  You have a fever along with a joint that is red, warm, and swollen.  Summary  Joint pain (arthralgia) may be caused by many things.  Your health care provider may recommend that you take pain medicine or wear a supportive device like an elastic bandage, sling, or splint.  If your joint pain continues, you may need tests to diagnose the cause of your joint pain.  Take over-the-counter and  prescription medicines only as told by your health care provider.  This information is not intended to replace advice given to you by your health care provider. Make sure you discuss any questions you have with your health care provider.  Document Released: 11/12/2005 Document Revised: 08/28/2017 Document Reviewed: 08/28/2017  Elsevier Interactive Patient Education  2019 [employer] Inc[employer]

## 2019-02-16 NOTE — Progress Notes (Signed)
Assessment & Plan:  Kaitlan was seen today for follow-up and follow-up.  Diagnoses and all orders for this visit:  Arthralgia of both hands -     acetaminophen (TYLENOL 8 HOUR ARTHRITIS PAIN) 650 MG CR tablet; Take 1 tablet (650 mg total) by mouth every 8 (eight) hours as needed for up to 30 days for pain.  Hypercalcemia -     Calcium, ionized -     Parathyroid hormone, intact (no Ca)    Patient has been counseled on age-appropriate routine health concerns for screening and prevention. These are reviewed and up-to-date. Referrals have been placed accordingly. Immunizations are up-to-date or declined.    Subjective:   Chief Complaint  Patient presents with  . Follow-up    Thumb Pain  . Follow-up    Vitamin D   HPI Crystal Doyle 48 y.o. female presents to office today for follow up to bilateral pointer finger joint pain.   Arthralgia She endorsed bilateral finger pain at her last office appointment with me on 01-12-2019. She works at a [employer] and over the past few months began to develop pain in the bilateral trapezoid bones of both hands. She has worked at [employer] for the past 6 years and endorses repetitive hand movements. She was prescribed diclofenac gel at her last office visit with me which today she reports has provided significant relief of her bilateral joint pain. Will continue diclofenac gel and tylenol arthritis OTC for now.   Review of Systems  Constitutional: Negative for fever, malaise/fatigue and weight loss.  HENT: Negative.  Negative for nosebleeds.   Eyes: Negative.  Negative for blurred vision, double vision and photophobia.  Respiratory: Negative.  Negative for cough and shortness of breath.   Cardiovascular: Negative.  Negative for chest pain, palpitations and leg swelling.  Gastrointestinal: Negative.  Negative for heartburn, nausea and vomiting.  Musculoskeletal: Positive for joint pain (relieved). Negative for myalgias.   Neurological: Negative.  Negative for dizziness, focal weakness, seizures and headaches.  Psychiatric/Behavioral: Negative.  Negative for suicidal ideas.    History reviewed. No pertinent past medical history.  History reviewed. No pertinent surgical history.  Family History  Problem Relation Age of Onset  . Diabetes Brother   . Kidney disease Neg Hx     Social History Reviewed with no changes to be made today.   Outpatient Medications Prior to Visit  Medication Sig Dispense Refill  . Cholecalciferol (D3-1000 PO) Take 1 tablet by mouth daily.    . diclofenac sodium (VOLTAREN) 1 % GEL Apply 2 g topically 4 (four) times daily.     No facility-administered medications prior to visit.     No Known Allergies     Objective:    BP 124/83 (BP Location: Left Arm, Patient Position: Sitting, Cuff Size: Normal)   Pulse 87   Temp 98.2 F (36.8 C) (Oral)   Resp 18   Ht 5\' 6"  (1.676 m)   Wt 150 lb (68 kg)   LMP 11/02/2013 Comment: lifelong irregular menses  SpO2 97%   BMI 24.21 kg/m  Wt Readings from Last 3 Encounters:  02/16/19 150 lb (68 kg)  01/12/19 147 lb 12.8 oz (67 kg)  05/16/16 145 lb 3.2 oz (65.9 kg)    Physical Exam Vitals signs and nursing note reviewed.  Constitutional:      Appearance: She is well-developed.  HENT:     Head: Normocephalic and atraumatic.  Neck:     Musculoskeletal: Normal range of  motion.  Cardiovascular:     Rate and Rhythm: Normal rate and regular rhythm.     Heart sounds: Normal heart sounds. No murmur. No friction rub. No gallop.   Pulmonary:     Effort: Pulmonary effort is normal. No tachypnea or respiratory distress.     Breath sounds: Normal breath sounds. No decreased breath sounds, wheezing, rhonchi or rales.  Chest:     Chest wall: No tenderness.  Abdominal:     General: Bowel sounds are normal.     Palpations: Abdomen is soft.  Musculoskeletal: Normal range of motion.  Skin:    General: Skin is warm and dry.  Neurological:      Mental Status: She is alert and oriented to person, place, and time.     Coordination: Coordination normal.  Psychiatric:        Behavior: Behavior normal. Behavior is cooperative.        Thought Content: Thought content normal.        Judgment: Judgment normal.          Patient has been counseled extensively about nutrition and exercise as well as the importance of adherence with medications and regular follow-up. The patient was given clear instructions to go to ER or return to medical center if symptoms don't improve, worsen or new problems develop. The patient verbalized understanding.   Follow-up: Return if symptoms worsen or fail to improve.   Claiborne Rigg, FNP-BC [facility] [place] and [facility] [place], [place] [phone]   02/16/2019, 3:12 PM

## 2019-02-17 LAB — PARATHYROID HORMONE, INTACT (NO CA): PTH: 44 pg/mL (ref 15–65)

## 2019-02-17 LAB — CALCIUM, IONIZED: CALCIUM ION: 5.6 mg/dL (ref 4.5–5.6)

## 2019-02-20 ENCOUNTER — Telehealth: Payer: Self-pay

## 2019-02-20 NOTE — Telephone Encounter (Signed)
-----   Message from Claiborne Rigg, NP sent at 02/17/2019 11:57 AM EDT ----- Labs are normal at this time. Your calcium has returned to normal.

## 2019-02-20 NOTE — Telephone Encounter (Signed)
CMA spoke to patient. Pt. Was informed on lab results and spoke to husband patient to translate for patient.   Pt. Verified DOB. Pt. Understood.

## 2019-05-04 ENCOUNTER — Other Ambulatory Visit: Payer: Self-pay

## 2019-05-04 ENCOUNTER — Encounter: Payer: Self-pay | Admitting: Family Medicine

## 2019-05-04 ENCOUNTER — Ambulatory Visit: Payer: Self-pay | Attending: Family Medicine | Admitting: Family Medicine

## 2019-05-04 VITALS — BP 117/79 | HR 81 | Temp 98.1°F | Ht 66.0 in | Wt 146.0 lb

## 2019-05-04 DIAGNOSIS — M79645 Pain in left finger(s): Secondary | ICD-10-CM

## 2019-05-04 DIAGNOSIS — G8929 Other chronic pain: Secondary | ICD-10-CM

## 2019-05-04 DIAGNOSIS — M25432 Effusion, left wrist: Secondary | ICD-10-CM

## 2019-05-04 DIAGNOSIS — M659 Synovitis and tenosynovitis, unspecified: Secondary | ICD-10-CM

## 2019-05-04 DIAGNOSIS — M25532 Pain in left wrist: Secondary | ICD-10-CM

## 2019-05-04 DIAGNOSIS — M65932 Unspecified synovitis and tenosynovitis, left forearm: Secondary | ICD-10-CM

## 2019-05-04 MED ORDER — NAPROXEN 500 MG PO TABS: ORAL_TABLET | ORAL | 0 refills | Status: DC

## 2019-05-04 MED ORDER — PREDNISONE 20 MG PO TABS: ORAL_TABLET | ORAL | 0 refills | Status: DC

## 2019-05-04 MED FILL — ?NAPROXEN 500 MG TABET: 500 | 15 days supply | Qty: 30 | Fill #0

## 2019-05-04 MED FILL — ?PREDNISONE 10 MG TABLET: 10 | 8 days supply | Qty: 16 | Fill #0

## 2019-05-04 NOTE — Progress Notes (Signed)
Per pt her right thumb hurts really back. 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

## 2019-05-04 NOTE — Patient Instructions (Signed)
Tenosynovitis °Tenosynovitis is inflammation of a tendon and the sleeve of tissue that covers the tendon (tendon sheath). A tendon is a cord of tissue that connects muscle to bone. Normally, a tendon slides smoothly inside its tendon sheath. Tenosynovitis limits movement of the tendon and surrounding tissues, which may cause pain and stiffness. °Tenosynovitis can affect any tendon and tendon sheath. Commonly affected areas include tendons in the: °· Shoulder. °· Arm. °· Hand. °· Hip. °· Leg. °· Foot. °What are the causes? °The main cause of this condition is wear and tear over time that results in slight tears in the tendon. Other possible causes include: °· A sudden injury to the tendon or tendon sheath. °· A disease that causes inflammation in the body. °· An infection that spreads to the tendon and tendon sheath from a skin wound. °· An infection in another part of the body that spreads to the tendon and tendon sheath through the blood. °What increases the risk? °The following factors may make you more likely to develop this condition: °· Having rheumatoid arthritis, gout, or diabetes. °· Using IV drugs. °· Doing physical activities that can cause tendon overuse and stress. °· Having gonorrhea. °What are the signs or symptoms? °Symptoms of this condition depend on the cause. Symptoms may include: °· Pain with movement. °· Pain and tenderness when pressing on the tendon and tendon sheath. °· Swelling. °· Stiffness. °If tenosynovitis is caused by an infection, symptoms may also include: °· Fever. °· Redness. °· Warmth. °How is this diagnosed? °This condition may be diagnosed based on your medical history and a physical exam. You also may have: °· Blood tests. °· Imaging tests, such as: °? MRI. °? Ultrasound. °· A sample of fluid removed from inside the tendon sheath to be checked in a lab. °How is this treated? °Treatment for this condition depends on the cause. If tenosynovitis is not caused by an infection,  treatment may include: °· Resting the tendon. °· Keeping the tendon in place for periods of time (immobilization) in a splint, brace, or sling. °· NSAIDs to reduce pain and swelling. °· A shot (injection) of medicine to help reduce pain and swelling (steroid). °· Icing or applying heat to the affected area. °· Physical therapy. °· Surgery to release the tendon in the sheath or to repair damage to the tendon or tendon sheath. Surgery may be done if other treatments do not help relieve symptoms. °If tenosynovitis is caused by infection, treatment may include antibiotic medicine given through an IV. In some cases, surgery may be needed to drain fluid from the tendon sheath or to remove the tendon sheath. °Follow these instructions at home: °If you have a splint, brace, or sling: ° °· Wear the splint, brace, or sling as told by your health care provider. Remove it only as told by your health care provider. °· Loosen the splint, brace, or sling if your fingers or toes tingle, become numb, or turn cold and blue. °· Do not let your splint or brace get wet if it is not waterproof. °? Do not take baths, swim, or use a hot tub until your health care provider approves. Ask your health care provider if you can take showers. °? If your splint, brace, or sling is not waterproof, cover it with a watertight covering when you take a bath or a shower. °· Keep the splint, brace, or sling clean. °Managing pain, stiffness, and swelling °· If directed, apply ice to the affected area. °? Put   ice in a plastic bag. °? Place a towel between your skin and the bag. °? Leave the ice on for 20 minutes, 2-3 times a day. °· Move the fingers or toes of the affected limb often, if this applies. This can help to prevent stiffness and lessen swelling. °· If directed, raise (elevate) the affected area above the level of your heart while you are sitting or lying down. °· If directed, apply heat to the affected area as often as told by your health care  provider. Use the heat source that your health care provider recommends, such as a moist heat pack or a heating pad. °? Place a towel between your skin and the heat source. °? Leave the heat on for 20-30 minutes. °? Remove the heat if your skin turns bright red. This is especially important if you are unable to feel pain, heat, or cold. You may have a greater risk of getting burned. °Driving °· Do not drive or operate heavy machinery while taking prescription pain medicine. °· Ask your health care provider when it is safe to drive if you have a splint or brace on any part of your arm or leg. °Activity °· Return to your normal activities as told by your health care provider. Ask your health care provider what activities are safe for you. °· Rest the affected area as told by your health care provider. °· Avoid using the affected area while you are having symptoms of tenosynovitis. °· If physical therapy was prescribed, do exercises as told by your health care provider. °Safety °· Do not use the injured limb to support your body weight until your health care provider says that you can. °General instructions °· Take over-the-counter and prescription medicines only as told by your health care provider. °· Keep all follow-up visits as told by your health care provider. This is important. °Contact a health care provider if: °· Your symptoms are not improving or are getting worse. °· You have a fever and more of any of the following symptoms: °? Pain. °? Redness. °? Warmth. °? Swelling. °Get help right away if: °· Your fingers or toes become numb or turn blue. °This information is not intended to replace advice given to you by your health care provider. Make sure you discuss any questions you have with your health care provider. °Document Released: 11/12/2005 Document Revised: 07/12/2016 Document Reviewed: 09/21/2015 °Elsevier Interactive Patient Education © 2019 Elsevier Inc. ° °

## 2019-05-05 ENCOUNTER — Encounter: Payer: Self-pay | Admitting: Family Medicine

## 2019-05-05 NOTE — Progress Notes (Signed)
Established Patient Office Visit  Subjective:  Patient ID: Crystal Doyle, female    DOB: 10-20-1971  Age: 48 y.o. MRN: [id number]  CC:  Chief Complaint  Patient presents with  . thumb pain    HPI Crystal Doyle presents for complaint of persistent pain in her left wrist and thumb.  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  She reports that she continues to have pain especially in her left thumb and wrist.  She feels as if at times she has swelling in her wrist and thumb.  Due to a slight language barrier, she was not able to quantify or qualify the degree of pain or quality of pain other than aching.  She does have worsening of pain when bending her wrist or when holding objects in her left hand.  She denies any pain in the left elbow.  She denies any numbness or tingling in her fingertips patient was prescribed Voltaren gel and she states that this is not helped with her pain.  She would like to have x-rays done as well as medication to help with the pain.  She denies any history of injury to the neck or shoulder.  She has minimal right hand pain.  Patient is right-handed.  Past Medical History:  Diagnosis Date  . Known health problems: none     Past Surgical History:  Procedure Laterality Date  . NO PAST SURGERIES      Family History  Problem Relation Age of Onset  . Diabetes Brother   . Kidney disease Neg Hx     Social History   Socioeconomic History  . Marital status: Married    Spouse name: Adil  . Number of children: 0  . Years of education: college  . Highest education level: Not on file  Occupational History  . Occupation: Ticketing    Comment: [employer]  Social Needs  . Financial resource strain: Not on file  . Food insecurity:    Worry: Not on file    Inability: Not on file  . Transportation needs:    Medical: Not on file    Non-medical: Not on file   Tobacco Use  . Smoking status: Never Smoker  . Smokeless tobacco: Never Used  Substance and Sexual Activity  . Alcohol use: No  . Drug use: No  . Sexual activity: Not on file  Lifestyle  . Physical activity:    Days per week: Not on file    Minutes per session: Not on file  . Stress: Not on file  Relationships  . Social connections:    Talks on phone: Not on file    Gets together: Not on file    Attends religious service: Not on file    Active member of club or organization: Not on file    Attends meetings of clubs or organizations: Not on file    Relationship status: Not on file  . Intimate partner violence:    Fear of current or ex partner: Not on file    Emotionally abused: Not on file    Physically abused: Not on file    Forced sexual activity: Not on file  Other Topics Concern  . Not on file  Social History Narrative   From [place].  Came to the [place] in 2004. Lives with her husband. Completed High school education in [place]  Outpatient Medications Prior to Visit  Medication Sig Dispense Refill  . B Complex Vitamins (VITAMIN-B COMPLEX PO) Take by mouth as needed.    . Cholecalciferol (D3-1000 PO) Take 1 tablet by mouth daily.    . diclofenac sodium (VOLTAREN) 1 % GEL Apply 2 g topically 4 (four) times daily.     No facility-administered medications prior to visit.     No Known Allergies  ROS Review of Systems  Constitutional: Negative for chills, fatigue and fever.  HENT: Negative for sore throat and trouble swallowing.   Respiratory: Negative for cough and shortness of breath.   Cardiovascular: Negative for chest pain and palpitations.  Gastrointestinal: Negative for abdominal pain, constipation, diarrhea and nausea.  Endocrine: Negative for cold intolerance, heat intolerance, polydipsia, polyphagia and polyuria.  Genitourinary: Negative for dysuria and frequency.  Musculoskeletal: Positive for arthralgias and joint swelling. Negative for back pain.   Neurological: Negative for dizziness and headaches.  Hematological: Negative for adenopathy. Does not bruise/bleed easily.      Objective:    Physical Exam  Constitutional: She is oriented to person, place, and time. She appears well-developed and well-nourished.  Cardiovascular: Normal rate and regular rhythm.  Pulmonary/Chest: Effort normal and breath sounds normal.  Abdominal: Soft. There is no abdominal tenderness. There is no rebound and no guarding.  Musculoskeletal:        General: Tenderness and edema present.       Arms:  Neurological: She is alert and oriented to person, place, and time.  Psychiatric: She has a normal mood and affect. Her behavior is normal.  Nursing note and vitals reviewed.   BP 117/79 (BP Location: Right Arm, Patient Position: Sitting, Cuff Size: Normal)   Pulse 81   Temp 98.1 F (36.7 C) (Oral)   Ht 5\' 6"  (1.676 m)   Wt 146 lb (66.2 kg)   LMP 11/02/2013 Comment: lifelong irregular menses  SpO2 95%   BMI 23.57 kg/m  Wt Readings from Last 3 Encounters:  05/04/19 146 lb (66.2 kg)  02/16/19 150 lb (68 kg)  01/12/19 147 lb 12.8 oz (67 kg)     Health Maintenance Due  Topic Date Due  . HIV Screening  03/15/1984  . TETANUS/TDAP  03/15/1988  . PAP SMEAR-Modifier  03/15/1990  . MAMMOGRAM  03/16/2019  . COLONOSCOPY  03/16/2019    There are no preventive care reminders to display for this patient.  No results found for: TSH Lab Results  Component Value Date   WBC 5.5 01/12/2019   HGB 14.2 01/12/2019   HCT 41.8 01/12/2019   MCV 83 01/12/2019   PLT 250 01/12/2019   Lab Results  Component Value Date   NA 140 01/12/2019   K 4.4 01/12/2019   CO2 28 01/12/2019   GLUCOSE 92 01/12/2019   BUN 15 01/12/2019   CREATININE 0.61 01/12/2019   BILITOT <0.2 01/12/2019   ALKPHOS 141 (H) 01/12/2019   AST 19 01/12/2019   ALT 15 01/12/2019   PROT 7.9 01/12/2019   ALBUMIN 4.7 01/12/2019   CALCIUM 11.4 (H) 01/12/2019   Lab Results  Component  Value Date   CHOL 242 (H) 01/12/2019   Lab Results  Component Value Date   HDL 47 01/12/2019   Lab Results  Component Value Date   LDLCALC 156 (H) 01/12/2019   Lab Results  Component Value Date   TRIG 194 (H) 01/12/2019   Lab Results  Component Value Date   CHOLHDL 5.1 (H) 01/12/2019   Lab Results  Component Value Date   HGBA1C 6.0 (A) 01/12/2019      Assessment & Plan:  1. Tenosynovitis of left wrist; 2. Chronic pain of left thumb, 3. Pain and swelling of left wrist Patient with symptoms and physical exam findings consistent with de Quervain's tenosynovitis of the left wrist.  She will be placed on a steroid taper and nonsteroidal anti-inflammatories.  Discussed orthopedic referral but she wishes to see if steroid taper is helpful.  Patient is reminded to make sure that she eats prior to taking the medication to help avoid stomach upset.  Patient would also like to have an x-ray and order placed for an x-ray of the left hand and left wrist.  Patient will follow-up in a few weeks if her symptoms have not improved. - DG Hand Complete Left; Future - DG Wrist Complete Left; Future - predniSONE (DELTASONE) 20 MG tablet; Take 2 pills once per day for days then 1 pill daily for 2 days then 1/2 pill daily for 4 days; take after eating  Dispense: 8 tablet; Refill: 0 - naproxen (NAPROSYN) 500 MG tablet; One pill twice per day after a meal as needed for pain  Dispense: 30 tablet; Refill: 0     Meds ordered this encounter  Medications  . predniSONE (DELTASONE) 20 MG tablet    Sig: Take 2 pills once per day for days then 1 pill daily for 2 days then 1/2 pill daily for 4 days; take after eating    Dispense:  8 tablet    Refill:  0  . naproxen (NAPROSYN) 500 MG tablet    Sig: One pill twice per day after a meal as needed for pain    Dispense:  30 tablet    Refill:  0    Follow-up: Return if symptoms worsen or fail to improve, for hand pani-3-4 weeks if not improved.    Antony Blackbird, MD

## 2019-05-08 ENCOUNTER — Other Ambulatory Visit: Payer: Self-pay

## 2019-05-08 ENCOUNTER — Ambulatory Visit (HOSPITAL_COMMUNITY)
Admission: RE | Admit: 2019-05-08 | Discharge: 2019-05-08 | Disposition: A | Discharge status: Home / Self Care | Payer: Self-pay | Source: Ambulatory Visit | Attending: Family Medicine | Admitting: Family Medicine

## 2019-05-08 DIAGNOSIS — M659 Synovitis and tenosynovitis, unspecified: Secondary | ICD-10-CM | POA: Insufficient documentation

## 2019-05-08 DIAGNOSIS — M25532 Pain in left wrist: Secondary | ICD-10-CM | POA: Insufficient documentation

## 2019-05-08 DIAGNOSIS — M79645 Pain in left finger(s): Secondary | ICD-10-CM | POA: Insufficient documentation

## 2019-05-08 DIAGNOSIS — M25432 Effusion, left wrist: Secondary | ICD-10-CM | POA: Insufficient documentation

## 2019-05-08 DIAGNOSIS — G8929 Other chronic pain: Secondary | ICD-10-CM

## 2019-05-26 ENCOUNTER — Encounter: Payer: Self-pay | Admitting: Nurse Practitioner

## 2019-05-26 ENCOUNTER — Other Ambulatory Visit: Payer: Self-pay

## 2019-05-26 ENCOUNTER — Ambulatory Visit: Payer: Self-pay | Attending: Nurse Practitioner | Admitting: Nurse Practitioner

## 2019-05-26 DIAGNOSIS — M654 Radial styloid tenosynovitis [de Quervain]: Secondary | ICD-10-CM

## 2019-05-26 NOTE — Progress Notes (Signed)
Virtual Visit via Telephone Note Due to national recommendations of social distancing due to [place] 19, telehealth visit is felt to be most appropriate for this patient at this time.  I discussed the limitations, risks, security and privacy concerns of performing an evaluation and management service by telephone and the availability of in person appointments. I also discussed with the patient that there may be a patient responsible charge related to this service. The patient expressed understanding and agreed to proceed.    I connected with Nkechi Linehan on 05/26/19  at   3:50 PM EDT  EDT by telephone and verified that I am speaking with the correct person using two identifiers.   Consent I discussed the limitations, risks, security and privacy concerns of performing an evaluation and management service by telephone and the availability of in person appointments. I also discussed with the patient that there may be a patient responsible charge related to this service. The patient expressed understanding and agreed to proceed.   Location of Patient: Private Residence   Location of Provider: [place] and [employer] Office    Persons participating in Telemedicine visit: Geryl Rankins FNP-BC [place]    History of Present Illness: Telemedicine visit for: Left hand pain  Diagnosed with de Quervain's tenosynovitis of the left wrist at her last appointment on 05-04-2019. She was treated with steroids and NSAIDs at that time. Orthopedic consult was recommended however she declined at that time. Today states pain has slightly improved however there is still significant pain and intermittent numbness in the left middle finger.    Past Medical History:  Diagnosis Date  . Known health problems: none     Past Surgical History:  Procedure Laterality Date  . NO PAST SURGERIES      Family History  Problem Relation Age of Onset  . Diabetes Brother   . Kidney  disease Neg Hx     Social History   Socioeconomic History  . Marital status: Married    Spouse name: Adil  . Number of children: 0  . Years of education: college  . Highest education level: Not on file  Occupational History  . Occupation: Ticketing    Comment: [employer]  Social Needs  . Financial resource strain: Not on file  . Food insecurity    Worry: Not on file    Inability: Not on file  . Transportation needs    Medical: Not on file    Non-medical: Not on file  Tobacco Use  . Smoking status: Never Smoker  . Smokeless tobacco: Never Used  Substance and Sexual Activity  . Alcohol use: No  . Drug use: No  . Sexual activity: Not on file  Lifestyle  . Physical activity    Days per week: Not on file    Minutes per session: Not on file  . Stress: Not on file  Relationships  . Social Herbalist on phone: Not on file    Gets together: Not on file    Attends religious service: Not on file    Active member of club or organization: Not on file    Attends meetings of clubs or organizations: Not on file    Relationship status: Not on file  Other Topics Concern  . Not on file  Social History Narrative   From [place].  Came to the [place] in 2004. Lives with her husband. Completed High school education in [place]     Observations/Objective:  Awake, alert and oriented x 3   Review of Systems  Constitutional: Negative for fever, malaise/fatigue and weight loss.  HENT: Negative.  Negative for nosebleeds.   Eyes: Negative.  Negative for blurred vision, double vision and photophobia.  Respiratory: Negative.  Negative for cough and shortness of breath.   Cardiovascular: Negative.  Negative for chest pain, palpitations and leg swelling.  Gastrointestinal: Negative.  Negative for heartburn, nausea and vomiting.  Musculoskeletal: Positive for joint pain. Negative for myalgias.       De Quervain's tenosynovitis of the left wrist.   Neurological: Negative.  Negative for dizziness,  focal weakness, seizures and headaches.  Psychiatric/Behavioral: Negative.  Negative for suicidal ideas.    Assessment and Plan: Ara KussmaulRaga was evaluated today for follow-up.  Diagnoses and all orders for this visit:  De Quervain's disease (tenosynovitis) -     Ambulatory referral to Hand Surgery Continue naproxen as prescribed.      Follow Up Instructions Return if symptoms worsen or fail to improve.     I discussed the assessment and treatment plan with the patient. The patient was provided an opportunity to ask questions and all were answered. The patient agreed with the plan and demonstrated an understanding of the instructions.   The patient was advised to call back or seek an in-person evaluation if the symptoms worsen or if the condition fails to improve as anticipated.  I provided 16 minutes of non-face-to-face time during this encounter including median intraservice time, reviewing previous notes, labs, imaging, medications and explaining diagnosis and management.  Claiborne RiggZelda W Fleming, FNP-BC

## 2019-06-09 ENCOUNTER — Encounter: Payer: Self-pay | Admitting: Orthopaedic Surgery

## 2019-06-09 ENCOUNTER — Ambulatory Visit (INDEPENDENT_AMBULATORY_CARE_PROVIDER_SITE_OTHER): Payer: Self-pay | Admitting: Orthopaedic Surgery

## 2019-06-09 ENCOUNTER — Other Ambulatory Visit: Payer: Self-pay

## 2019-06-09 DIAGNOSIS — M654 Radial styloid tenosynovitis [de Quervain]: Secondary | ICD-10-CM

## 2019-06-09 MED ORDER — BUPIVACAINE HCL 0.25 % IJ SOLN
0.3300 mL | INTRAMUSCULAR | Status: AC | PRN
  Administered 2019-06-09: .33 mL

## 2019-06-09 MED ORDER — METHYLPREDNISOLONE ACETATE 40 MG/ML IJ SUSP
13.3300 mg | INTRAMUSCULAR | Status: AC | PRN
  Administered 2019-06-09: 13.33 mg

## 2019-06-09 MED ORDER — LIDOCAINE HCL 1 % IJ SOLN
0.3000 mL | INTRAMUSCULAR | Status: AC | PRN
  Administered 2019-06-09: .3 mL

## 2019-06-09 NOTE — Progress Notes (Signed)
Office Visit Note   Patient: Crystal Doyle           Date of Birth: 01-Mar-1971           MRN: [id number] Visit Date: 06/09/2019              Requested by: Claiborne RiggFleming, Zelda W, NP [address] [place],  [place] [id number] PCP: Claiborne RiggFleming, Zelda W, NP   Assessment & Plan: Visit Diagnoses:  1. De Quervain's disease (tenosynovitis)     Plan: Impression is left wrist de Quervain's tenosynovitis versus possible early arthritis to the left first [facility] joint.  We will inject the first dorsal compartment with cortisone as this appears to be more symptomatic.  We will also provide her with a thumb spica splint.  If she does not get significant relief from this injection, we may consider injecting the first [facility] joint.  She will follow-up with us as needed.  Follow-Up Instructions: Return if symptoms worsen or fail to improve.   Orders:  Orders Placed This Encounter  Procedures   Hand/UE Inj: L extensor compartment 1   No orders of the defined types were placed in this encounter.     Procedures: Hand/UE Inj: L extensor compartment 1 for de Quervain's tenosynovitis on 06/09/2019 9:00 AM Medications: 0.33 mL bupivacaine 0.25 %; 0.3 mL lidocaine 1 %; 13.33 mg methylPREDNISolone acetate 40 MG/ML      Clinical Data: No additional findings.   Subjective: No chief complaint on file.   HPI patient is a pleasant 48 year old right-hand-dominant female who presents our clinic today with left wrist and thumb pain.  This has been ongoing for the past several months and is progressively worsened.  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  The pain she has is over the first dorsal compartment and into the first [facility] joint.  Pain is worse with any motion of the wrist and thumb.  She has tried oral and topical anti-inflammatories with mild relief of symptoms.  No previous splinting or cortisone injection.  She denies any  numbness, tingling or burning.  Review of Systems as detailed in HPI.  All others reviewed and are negative.   Objective: Vital Signs: LMP 11/02/2013 Comment: lifelong irregular menses  Physical Exam well-developed and well-nourished female in no acute distress.  Alert and oriented x3.  Ortho Exam examination of her left wrist reveals moderate tenderness over the first dorsal compartment as well as the first [facility].  Mildly positive grind test.  Positive Finkelstein.  Full range of motion of the wrist and all 5 fingers.  She has full sensation distally.  Specialty Comments:  No specialty comments available.  Imaging: Imaging of the left wrist reviewed by me show minimal changes to the first [facility].  Otherwise, no acute findings   PMFS History: Patient Active Problem List   Diagnosis Date Noted   Blurry vision, bilateral 05/16/2016   Preventative health care 05/16/2016   Past Medical History:  Diagnosis Date   Known health problems: none     Family History  Problem Relation Age of Onset   Diabetes Brother    Kidney disease Neg Hx     Past Surgical History:  Procedure Laterality Date   NO PAST SURGERIES     Social History   Occupational History   Occupation: Ticketing    Comment: [employer]  Tobacco Use   Smoking  status: Never Smoker   Smokeless tobacco: Never Used  Substance and Sexual Activity   Alcohol use: No   Drug use: No   Sexual activity: Not on file

## 2019-08-17 ENCOUNTER — Ambulatory Visit: Payer: PRIVATE HEALTH INSURANCE | Attending: Nurse Practitioner | Admitting: Pharmacist

## 2019-08-17 ENCOUNTER — Ambulatory Visit: Payer: PRIVATE HEALTH INSURANCE | Attending: Nurse Practitioner | Admitting: Nurse Practitioner

## 2019-08-17 ENCOUNTER — Other Ambulatory Visit: Payer: Self-pay

## 2019-08-17 DIAGNOSIS — Z23 Encounter for immunization: Secondary | ICD-10-CM

## 2019-08-17 NOTE — Progress Notes (Signed)
Patient presents for vaccination against influenza per orders of Zelda. Consent given. Counseling provided. No contraindications exists. Vaccine administered without incident.   

## 2019-08-18 NOTE — Progress Notes (Signed)
Virtual Visit via Telephone Note  I connected with Crystal Doyle on 08/18/19 at  3:10 PM EDT by telephone and verified that I am speaking with the correct person using two identifiers.   I discussed the limitations, risks, security and privacy concerns of performing an evaluation and management service by telephone and the availability of in person appointments. I also discussed with the patient that there may be a patient responsible charge related to this service. The patient expressed understanding and agreed to proceed.  Patient location:  home My Location:  [place] office Persons on the call:  Me, the patient, and interpreter   History of Present Illness:    Observations/Objective:   Assessment and Plan:   Follow Up Instructions:    I discussed the assessment and treatment plan with the patient. The patient was provided an opportunity to ask questions and all were answered. The patient agreed with the plan and demonstrated an understanding of the instructions.   The patient was advised to call back or seek an in-person evaluation if the symptoms worsen or if the condition fails to improve as anticipated.  I provided *** minutes of non-face-to-face time during this encounter.   Freeman Caldron, PA-C  Patient ID: Crystal Doyle, female   DOB: February 11, 1971, 48 y.o.   MRN: [id number]

## 2019-08-19 ENCOUNTER — Other Ambulatory Visit: Payer: Self-pay

## 2019-08-19 ENCOUNTER — Ambulatory Visit: Payer: PRIVATE HEALTH INSURANCE | Attending: Nurse Practitioner | Admitting: Physician Assistant

## 2019-08-26 ENCOUNTER — Ambulatory Visit: Payer: PRIVATE HEALTH INSURANCE | Attending: Nurse Practitioner | Admitting: Physician Assistant

## 2019-08-26 ENCOUNTER — Other Ambulatory Visit: Payer: Self-pay

## 2019-08-26 DIAGNOSIS — K0889 Other specified disorders of teeth and supporting structures: Secondary | ICD-10-CM

## 2019-08-26 NOTE — Progress Notes (Signed)
Patient ID: Crystal Doyle, female   DOB: 11-10-1971, 48 y.o.   MRN: [id number] Virtual Visit via Telephone Note  I connected with Crystal Doyle on 08/26/19 at  4:10 PM EDT by telephone and verified that I am speaking with the correct person using two identifiers.   I discussed the limitations, risks, security and privacy concerns of performing an evaluation and management service by telephone and the availability of in person appointments. I also discussed with the patient that there may be a patient responsible charge related to this service. The patient expressed understanding and agreed to proceed.  Patient location:  Home My Location:  home office Persons on the call:  Me and the patient.  History of Present Illness: Patient c/o L bottom tooth pain.  This occurs on and off.  No fever.  Causes HA sometimes.   This has been occurring for over a year.  Tylenol or advil helps   Observations/Objective: A&Ox3   Assessment and Plan: 1. Tooth pain - Ambulatory referral to Dentistry    Follow Up Instructions: prn   I discussed the assessment and treatment plan with the patient. The patient was provided an opportunity to ask questions and all were answered. The patient agreed with the plan and demonstrated an understanding of the instructions.   The patient was advised to call back or seek an in-person evaluation if the symptoms worsen or if the condition fails to improve as anticipated.  I provided 6 minutes of non-face-to-face time during this encounter.   Freeman Caldron, PA-C

## 2019-10-19 ENCOUNTER — Ambulatory Visit: Payer: Self-pay | Admitting: Family Medicine

## 2020-04-06 ENCOUNTER — Telehealth: Payer: Self-pay | Admitting: Nurse Practitioner

## 2020-04-06 NOTE — Telephone Encounter (Signed)
[employer] FMLA paperwork dropped off to be completed and faxed to [phone]

## 2020-04-11 NOTE — Telephone Encounter (Signed)
Attempt to call patient back to inform she will need to have her husband primary care provider to fill out her FMLA form for taking him to his doctor appt.  No answer and LVM for call back.

## 2020-07-29 ENCOUNTER — Ambulatory Visit: Payer: Self-pay | Admitting: Family Medicine

## 2020-08-03 ENCOUNTER — Ambulatory Visit: Payer: Self-pay

## 2020-08-03 ENCOUNTER — Ambulatory Visit: Payer: Self-pay | Admitting: Family Medicine

## 2020-08-05 ENCOUNTER — Ambulatory Visit: Payer: Self-pay | Admitting: Family Medicine

## 2020-08-05 ENCOUNTER — Encounter: Payer: Self-pay | Admitting: Family Medicine

## 2020-08-05 ENCOUNTER — Other Ambulatory Visit: Payer: Self-pay

## 2020-08-05 ENCOUNTER — Ambulatory Visit: Payer: Self-pay

## 2020-08-05 VITALS — BP 136/86 | HR 76 | Ht 66.0 in | Wt 161.2 lb

## 2020-08-05 DIAGNOSIS — Z758 Other problems related to medical facilities and other health care: Secondary | ICD-10-CM

## 2020-08-05 DIAGNOSIS — Z789 Other specified health status: Secondary | ICD-10-CM

## 2020-08-05 DIAGNOSIS — K0889 Other specified disorders of teeth and supporting structures: Secondary | ICD-10-CM

## 2020-08-05 DIAGNOSIS — Z603 Acculturation difficulty: Secondary | ICD-10-CM

## 2020-08-05 MED ORDER — AMOXICILLIN 500 MG PO CAPS: 500.0000 mg | ORAL_CAPSULE | Freq: Two times a day (BID) | ORAL | 0 refills | Status: AC

## 2020-08-05 MED ORDER — IBUPROFEN 600 MG PO TABS: 600.0000 mg | ORAL_TABLET | Freq: Three times a day (TID) | ORAL | 0 refills | Status: AC | PRN

## 2020-08-05 NOTE — Progress Notes (Signed)
Established Patient Office Visit  Subjective:  Patient ID: Crystal Doyle, female    DOB: 12-05-1970  Age: 49 y.o. MRN: [id number]  CC: tooth pain-CJF  HPI Crystal Doyle, 49 yo female who is a patient of Z. Meredeth Ide, NP who was last evaluated by telemedicine visit on 08/26/2019 for tooth pain and headache. She reports that her right lower back molar was broken and part of the tooth removed but there is still some of the tooth there. She also has pain in the left lower last molar. She reports increased pain with chewing. She does not feel as if she has had any facial swelling. She denies any fever or chills. She does have occasional frontal headaches. She denies any swollen lymph nodes however points to an area beneath the chin/upper neck at the jawline that is sometimes tender.  Past Medical History:  Diagnosis Date  . Known health problems: none     Past Surgical History:  Procedure Laterality Date  . NO PAST SURGERIES      Family History  Problem Relation Age of Onset  . Diabetes Brother   . Kidney disease Neg Hx     Social History   Socioeconomic History  . Marital status: Married    Spouse name: Adil  . Number of children: 0  . Years of education: college  . Highest education level: Not on file  Occupational History  . 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 Smoking status: Never Smoker  . Smokeless tobacco: Never Used  Vaping Use  . Vaping Use: Never used  Substance and Sexual Activity  . Alcohol use: No  . Drug use: No  . Sexual activity: Not on file  Other Topics Concern  . Not on file  Social History Narrative   From [place].  Came to the [place] in 2004. Lives with her husband. Completed High school education in [place]   Social Determinants of Health   Financial Resource Strain:   . Difficulty of Paying Living Expenses: Not on file  Food Insecurity:   . Worried About Programme researcher, broadcasting/film/video in the Last Year: Not on file  . Ran Out of Food  in the Last Year: Not on file  Transportation Needs:   . Lack of Transportation (Medical): Not on file  . Lack of Transportation (Non-Medical): Not on file  Physical Activity:   . Days of Exercise per Week: Not on file  . Minutes of Exercise per Session: Not on file  Stress:   . Feeling of Stress : Not on file  Social Connections:   . Frequency of Communication with Friends and Family: Not on file  . Frequency of Social Gatherings with Friends and Family: Not on file  . Attends Religious Services: Not on file  . Active Member of Clubs or Organizations: Not on file  . Attends Banker Meetings: Not on file  . Marital Status: Not on file  Intimate Partner Violence:   . Fear of Current or Ex-Partner: Not on file  . Emotionally Abused: Not on file  . Physically Abused: Not on file  . Sexually Abused: Not on file    Outpatient Medications Prior to Visit  Medication Sig Dispense Refill  . B Complex Vitamins (VITAMIN-B COMPLEX PO) Take by mouth as needed.    . Cholecalciferol (D3-1000 PO) Take 1 tablet by mouth daily.    . diclofenac sodium (VOLTAREN) 1 % GEL Apply 2 g topically 4 (  four) times daily.    . naproxen (NAPROSYN) 500 MG tablet One pill twice per day after a meal as needed for pain (Patient not taking: Reported on 08/26/2019) 30 tablet 0   No facility-administered medications prior to visit.    No Known Allergies  ROS Review of Systems  Constitutional: Positive for fatigue. Negative for chills and fever.  HENT: Positive for dental problem. Negative for facial swelling.   Respiratory: Negative for cough and shortness of breath.   Cardiovascular: Negative for chest pain and palpitations.  Endocrine: Negative for polydipsia, polyphagia and polyuria.  Neurological: Positive for headaches. Negative for dizziness.      Objective:    Physical Exam Vitals and nursing note reviewed.  Constitutional:      Appearance: Normal appearance.  HENT:      Mouth/Throat:     Mouth: Mucous membranes are moist.     Pharynx: No oropharyngeal exudate or posterior oropharyngeal erythema.     Comments: Patient with left posterior molar tooth decay/cavity and breakage/missing portion of right lower posterior molar. No acute facial swelling. Mild discomfort to palpation on the outside of the jaw along the lower posterior gumline bilaterally Cardiovascular:     Rate and Rhythm: Normal rate and regular rhythm.  Pulmonary:     Effort: Pulmonary effort is normal.     Breath sounds: Normal breath sounds.  Musculoskeletal:     Cervical back: Normal range of motion and neck supple.  Lymphadenopathy:     Cervical: Cervical adenopathy (Mild slightly tender upper cervical chain lymphadenopathy) present.  Neurological:     Mental Status: She is alert.     LMP 11/02/2013 Comment: lifelong irregular menses  BP 136/86   Pulse 76   Ht 5\' 6"  (1.676 m)   Wt 161 lb 3.2 oz (73.1 kg)   LMP 11/02/2013 Comment: lifelong irregular menses  SpO2 99%   BMI 26.02 kg/m  Wt Readings from Last 3 Encounters:  05/04/19 146 lb (66.2 kg)  02/16/19 150 lb (68 kg)  01/12/19 147 lb 12.8 oz (67 kg)     Health Maintenance Due  Topic Date Due  . Hepatitis C Screening  Never done  . HIV Screening  Never done  . TETANUS/TDAP  Never done  . PAP SMEAR-Modifier  Never done  . MAMMOGRAM  Never done  . COLONOSCOPY  Never done  . INFLUENZA VACCINE  06/26/2020     No results found for: TSH Lab Results  Component Value Date   WBC 5.5 01/12/2019   HGB 14.2 01/12/2019   HCT 41.8 01/12/2019   MCV 83 01/12/2019   PLT 250 01/12/2019   Lab Results  Component Value Date   NA 140 01/12/2019   K 4.4 01/12/2019   CO2 28 01/12/2019   GLUCOSE 92 01/12/2019   BUN 15 01/12/2019   CREATININE 0.61 01/12/2019   BILITOT <0.2 01/12/2019   ALKPHOS 141 (H) 01/12/2019   AST 19 01/12/2019   ALT 15 01/12/2019   PROT 7.9 01/12/2019   ALBUMIN 4.7 01/12/2019   CALCIUM 11.4 (H)  01/12/2019   Lab Results  Component Value Date   CHOL 242 (H) 01/12/2019   Lab Results  Component Value Date   HDL 47 01/12/2019   Lab Results  Component Value Date   LDLCALC 156 (H) 01/12/2019   Lab Results  Component Value Date   TRIG 194 (H) 01/12/2019   Lab Results  Component Value Date   CHOLHDL 5.1 (H) 01/12/2019   Lab Results  Component Value Date   HGBA1C 6.0 (A) 01/12/2019      Assessment & Plan:  1. Tooth pain She has not been able to afford dental follow-up due to lack of insurance. She does have an appointment today with the financial counselors to apply for the [facility] financial discount program. Prescriptions will be sent into the pharmacy for amoxicillin in case of infection and prescription for ibuprofen to take as needed for pain. Referral has been made for dentistry follow-up. - amoxicillin (AMOXIL) 500 MG capsule; Take 1 capsule (500 mg total) by mouth 2 (two) times daily for 10 days.  Dispense: 30 capsule; Refill: 0 - Ambulatory referral to Dentistry - ibuprofen (ADVIL) 600 MG tablet; Take 1 tablet (600 mg total) by mouth every 8 (eight) hours as needed. For pain; take after eating  Dispense: 30 tablet; Refill: 0  2. Language barrier Video interpretation system used at today's visit to help with language barrier.     Follow-up: Return for tooth pain- as needed.   Cain Saupe, MD

## 2020-08-10 ENCOUNTER — Ambulatory Visit: Payer: Self-pay

## 2020-12-08 ENCOUNTER — Ambulatory Visit: Payer: Self-pay | Admitting: Physician Assistant

## 2020-12-14 ENCOUNTER — Encounter: Payer: Self-pay | Admitting: Physician Assistant

## 2020-12-14 ENCOUNTER — Ambulatory Visit: Payer: Self-pay | Attending: Physician Assistant | Admitting: Physician Assistant

## 2020-12-14 ENCOUNTER — Other Ambulatory Visit: Payer: Self-pay

## 2020-12-14 VITALS — BP 143/89 | HR 92 | Temp 98.9°F | Resp 16 | Wt 156.2 lb

## 2020-12-14 DIAGNOSIS — E559 Vitamin D deficiency, unspecified: Secondary | ICD-10-CM

## 2020-12-14 DIAGNOSIS — R030 Elevated blood-pressure reading, without diagnosis of hypertension: Secondary | ICD-10-CM

## 2020-12-14 DIAGNOSIS — R5383 Other fatigue: Secondary | ICD-10-CM

## 2020-12-14 DIAGNOSIS — R739 Hyperglycemia, unspecified: Secondary | ICD-10-CM

## 2020-12-14 DIAGNOSIS — L678 Other hair color and hair shaft abnormalities: Secondary | ICD-10-CM

## 2020-12-14 NOTE — Progress Notes (Signed)
Crystal Doyle, is a 50 y.o. female  OZD:[id number]  QVZ:[id number]  DOB - [birth year]  Subjective:  Chief Complaint and HPI: Crystal Doyle is a 50 y.o. female here today for a general check up and with a few issues.  H/o vitamin d deficiency and hyperglycemia.  She also c/o hair growth on her upper lip she has to shave.  Also has fatigue.  Her husband has ESRD and is on dialysis awaiting kidney transplant so she wanted to get a check up.  And, they may travel overseas.  She does not want to get a MMG at this time.   ROS:   Constitutional:  No f/c, No night sweats, No unexplained weight loss. EENT:  No vision changes, No blurry vision, No hearing changes. No mouth, throat, or ear problems.  Respiratory: No cough, No SOB Cardiac: No CP, no palpitations GI:  No abd pain, No N/V/D. GU: No Urinary s/sx Musculoskeletal: No joint pain Neuro: No headache, no dizziness, no motor weakness.  Skin: No rash Endocrine:  No polydipsia. No polyuria.  Psych: Denies SI/HI  No problems updated.  ALLERGIES: No Known Allergies  PAST MEDICAL HISTORY: Past Medical History:  Diagnosis Date  . Known health problems: none     MEDICATIONS AT HOME: Prior to Admission medications   Medication Sig Start Date End Date Taking? Authorizing Provider  B Complex Vitamins (VITAMIN-B COMPLEX PO) Take by mouth as needed.    [provider]  Cholecalciferol (D3-1000 PO) Take 1 tablet by mouth daily.    [provider]  diclofenac sodium (VOLTAREN) 1 % GEL Apply 2 g topically 4 (four) times daily.    [provider]  ibuprofen (ADVIL) 600 MG tablet Take 1 tablet (600 mg total) by mouth every 8 (eight) hours as needed. For pain; take after eating 08/05/20   Fulp, Cammie, MD     Objective:  EXAM:   Vitals:   12/14/20 1339  BP: (!) 143/89  Pulse: 92  Resp: 16  Temp: 98.9 F (37.2 C)  SpO2: 95%  Weight: 156 lb 3.2 oz (70.9 kg)    General appearance : A&OX3. NAD.  Non-toxic-appearing HEENT: Atraumatic and Normocephalic.  PERRLA. EOM intact.  Mild upper lip hair growth Neck: supple, no JVD. No cervical lymphadenopathy. No thyromegaly Chest/Lungs:  Breathing-non-labored, Good air entry bilaterally, breath sounds normal without rales, rhonchi, or wheezing  CVS: S1 S2 regular, no murmurs, gallops, rubs  Extremities: Bilateral Lower Ext shows no edema, both legs are warm to touch with = pulse throughout Neurology:  CN II-XII grossly intact, Non focal.   Psych:  TP linear. J/I WNL. Normal speech. Appropriate eye contact and affect.  Skin:  No Rash  Data Review Lab Results  Component Value Date   HGBA1C 6.0 (A) 01/12/2019   HGBA1C 6.0 05/16/2016     Assessment & Plan   1. Vitamin D deficiency - Vitamin D, 25-hydroxy  2. Abnormal direction of hair growth - Thyroid Panel With TSH  3. Serum calcium elevated - Comprehensive metabolic panel  4. Hyperglycemia I have had a lengthy discussion and provided education about insulin resistance and the intake of too much sugar/refined carbohydrates.  I have advised the patient to work at a goal of eliminating sugary drinks, candy, desserts, sweets, refined sugars, processed foods, and white carbohydrates.  The patient expresses understanding.  - Hemoglobin A1c  5. Elevated blood pressure reading Check BP OOO 2-3 times weekly and record and bring to next visit.  We have discussed target BP range and blood pressure goal. I have advised patient to check BP regularly and to call us back or report to clinic if the numbers are consistently higher than 140/90. We discussed the importance of compliance with medical therapy and DASH diet recommended, consequences of uncontrolled hypertension discussed.   6. Fatigue, unspecified type - CBC with Differential/Platelet  Patient have been counseled extensively about nutrition and exercise  Return in about 6 months (around 06/13/2021) for PCP.  The patient was given  clear instructions to go to ER or return to medical center if symptoms don't improve, worsen or new problems develop. The patient verbalized understanding. The patient was told to call to get lab results if they haven't heard anything in the next week.     Georgian Co, PA-C [facility] [place], [place] [phone]   12/14/2020, 2:30 PM

## 2020-12-14 NOTE — Progress Notes (Signed)
Pt is requesting blood work especially for her thyroid and to figure out what blood type she is   Pt states she wants to talk with the provider in regards to hair growing on her cheeks

## 2020-12-15 ENCOUNTER — Other Ambulatory Visit: Payer: Self-pay | Admitting: Physician Assistant

## 2020-12-15 DIAGNOSIS — E559 Vitamin D deficiency, unspecified: Secondary | ICD-10-CM

## 2020-12-15 DIAGNOSIS — E1165 Type 2 diabetes mellitus with hyperglycemia: Secondary | ICD-10-CM

## 2020-12-15 DIAGNOSIS — R748 Abnormal levels of other serum enzymes: Secondary | ICD-10-CM

## 2020-12-15 DIAGNOSIS — L678 Other hair color and hair shaft abnormalities: Secondary | ICD-10-CM

## 2020-12-15 LAB — COMPREHENSIVE METABOLIC PANEL
ALT: 21 IU/L (ref 0–32)
AST: 20 IU/L (ref 0–40)
Albumin/Globulin Ratio: 1.4 (ref 1.2–2.2)
Albumin: 4.5 g/dL (ref 3.8–4.9)
Alkaline Phosphatase: 160 IU/L — ABNORMAL HIGH (ref 44–121)
BUN/Creatinine Ratio: 25 — ABNORMAL HIGH (ref 9–23)
BUN: 14 mg/dL (ref 6–24)
Bilirubin Total: <0.2 mg/dL (ref 0.0–1.2)
CO2: 23 mmol/L (ref 20–29)
Calcium: 10.9 mg/dL — ABNORMAL HIGH (ref 8.7–10.2)
Chloride: 101 mmol/L (ref 96–106)
Creatinine, Ser: 0.56 mg/dL — ABNORMAL LOW (ref 0.57–1.00)
GFR calc Af Amer: 125 mL/min/{1.73_m2} (ref >59)
GFR calc non Af Amer: 108 mL/min/{1.73_m2} (ref >59)
Globulin, Total: 3.3 g/dL (ref 1.5–4.5)
Glucose: 148 mg/dL — ABNORMAL HIGH (ref 65–99)
Potassium: 4.4 mmol/L (ref 3.5–5.2)
Sodium: 142 mmol/L (ref 134–144)
Total Protein: 7.8 g/dL (ref 6.0–8.5)

## 2020-12-15 LAB — CBC WITH DIFFERENTIAL/PLATELET
Basophils Absolute: 0 10*3/uL (ref 0.0–0.2)
Basos: 0 %
EOS (ABSOLUTE): 0.1 10*3/uL (ref 0.0–0.4)
Eos: 3 %
Hematocrit: 42.7 % (ref 34.0–46.6)
Hemoglobin: 13.6 g/dL (ref 11.1–15.9)
Immature Grans (Abs): 0 10*3/uL (ref 0.0–0.1)
Immature Granulocytes: 0 %
Lymphocytes Absolute: 2.1 10*3/uL (ref 0.7–3.1)
Lymphs: 40 %
MCH: 27.1 pg (ref 26.6–33.0)
MCHC: 31.9 g/dL (ref 31.5–35.7)
MCV: 85 fL (ref 79–97)
Monocytes Absolute: 0.4 10*3/uL (ref 0.1–0.9)
Monocytes: 8 %
Neutrophils Absolute: 2.6 10*3/uL (ref 1.4–7.0)
Neutrophils: 49 %
Platelets: 255 10*3/uL (ref 150–450)
RBC: 5.01 x10E6/uL (ref 3.77–5.28)
RDW: 13.2 % (ref 11.7–15.4)
WBC: 5.2 10*3/uL (ref 3.4–10.8)

## 2020-12-15 LAB — VITAMIN D 25 HYDROXY (VIT D DEFICIENCY, FRACTURES): Vit D, 25-Hydroxy: 26.4 ng/mL — ABNORMAL LOW (ref 30.0–100.0)

## 2020-12-15 LAB — THYROID PANEL WITH TSH
Free Thyroxine Index: 1.9 (ref 1.2–4.9)
T3 Uptake Ratio: 27 % (ref 24–39)
T4, Total: 7.1 ug/dL (ref 4.5–12.0)
TSH: 1 u[IU]/mL (ref 0.450–4.500)

## 2020-12-15 LAB — HEMOGLOBIN A1C
Est. average glucose Bld gHb Est-mCnc: 134 mg/dL
Hgb A1c MFr Bld: 6.3 % — ABNORMAL HIGH (ref 4.8–5.6)

## 2020-12-15 MED ORDER — METFORMIN HCL 500 MG PO TABS: 500.0000 mg | ORAL_TABLET | Freq: Two times a day (BID) | ORAL | 3 refills | Status: DC

## 2020-12-26 ENCOUNTER — Telehealth: Payer: Self-pay | Admitting: Nurse Practitioner

## 2020-12-26 NOTE — Telephone Encounter (Signed)
Copied from CRM [id number]. Topic: General - Other >> Dec 19, 2020  4:19 PM Tamela Oddi wrote: Reason for CRM: Patient would like the nurse to call her regarding her new medication.  She stated that she picked up Metformin from the pharmacy and the pharmacist said it was for diabetes.  Patient is not aware if she has diabetes.  Please cal patient to discuss at [phone]

## 2020-12-29 NOTE — Telephone Encounter (Signed)
RN Genella Rife spoke to patient and informed on lab results.

## 2021-01-06 ENCOUNTER — Encounter: Payer: Self-pay | Admitting: Endocrinology

## 2021-02-04 IMAGING — DX LEFT WRIST - COMPLETE 3+ VIEW
4 series · 4 of 4 positions shown · non-contrast
Comparison: None.

CLINICAL DATA: Left wrist pain.  No known injury.

EXAM:
LEFT WRIST - COMPLETE 3+ VIEW

[wrist pa]
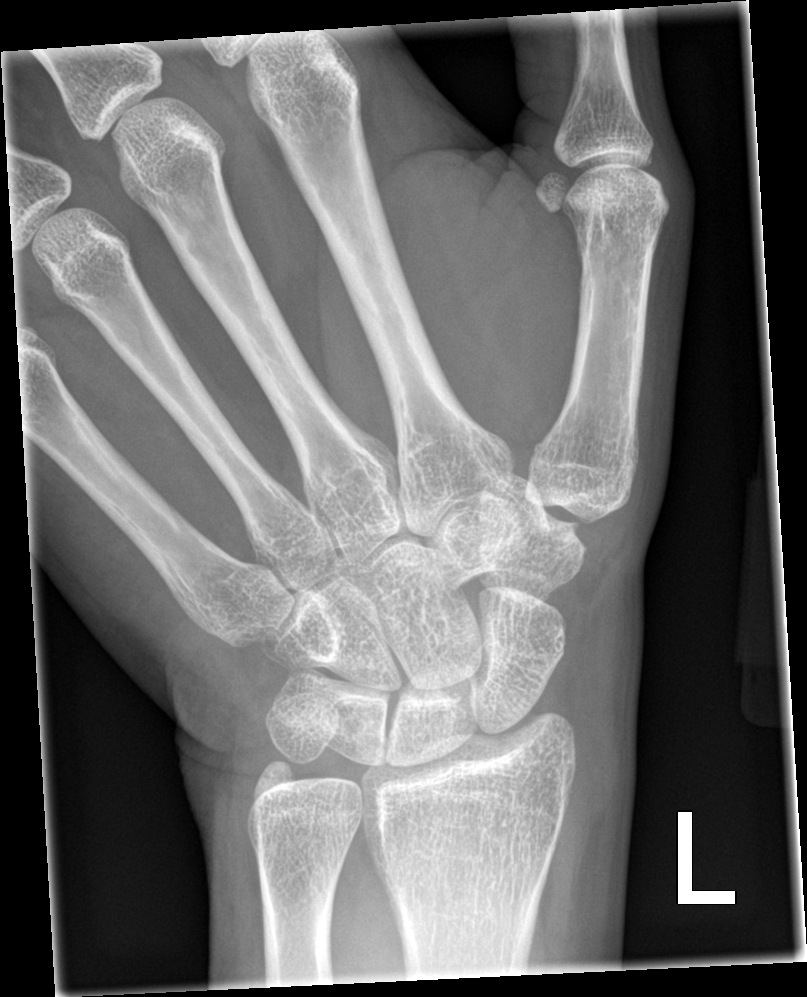

[wrist obl]
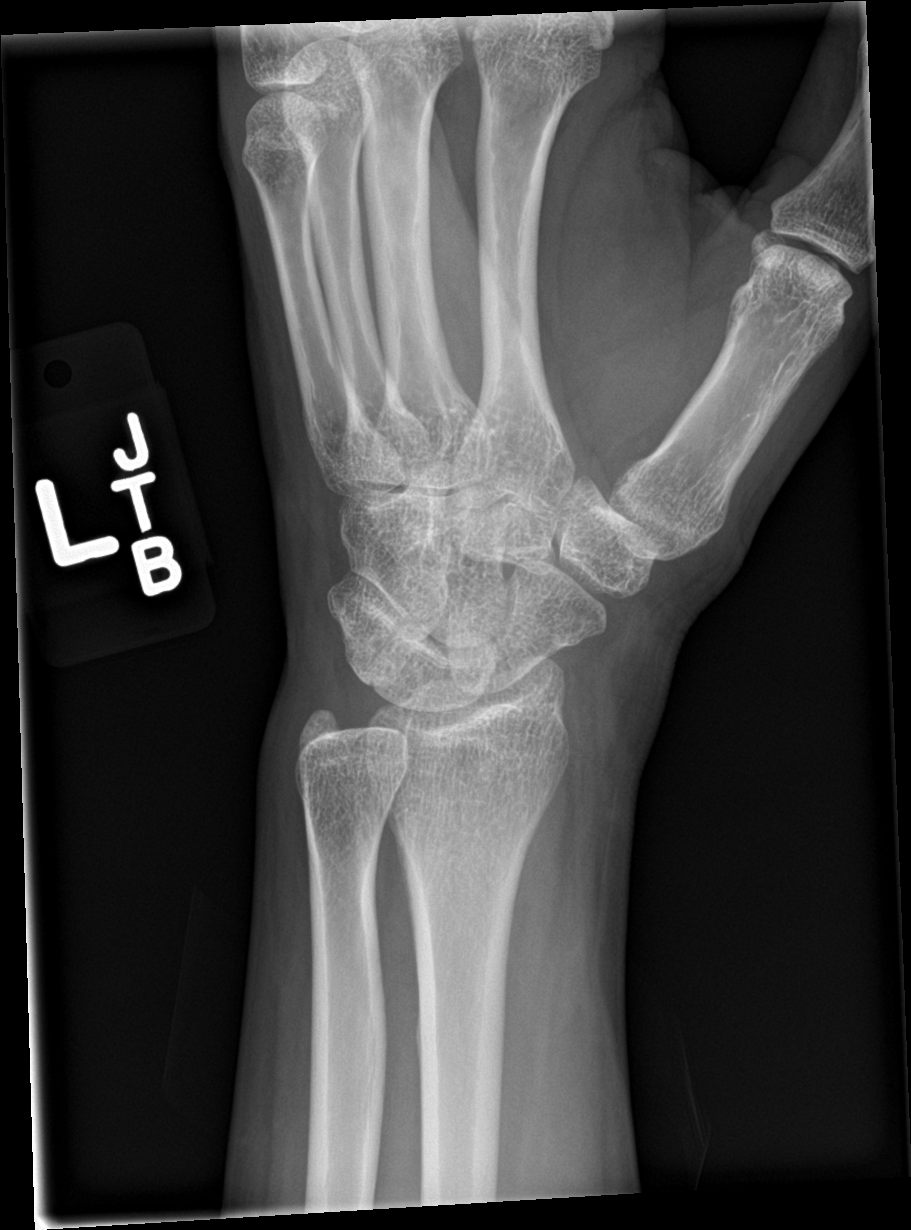

[wrist lat]
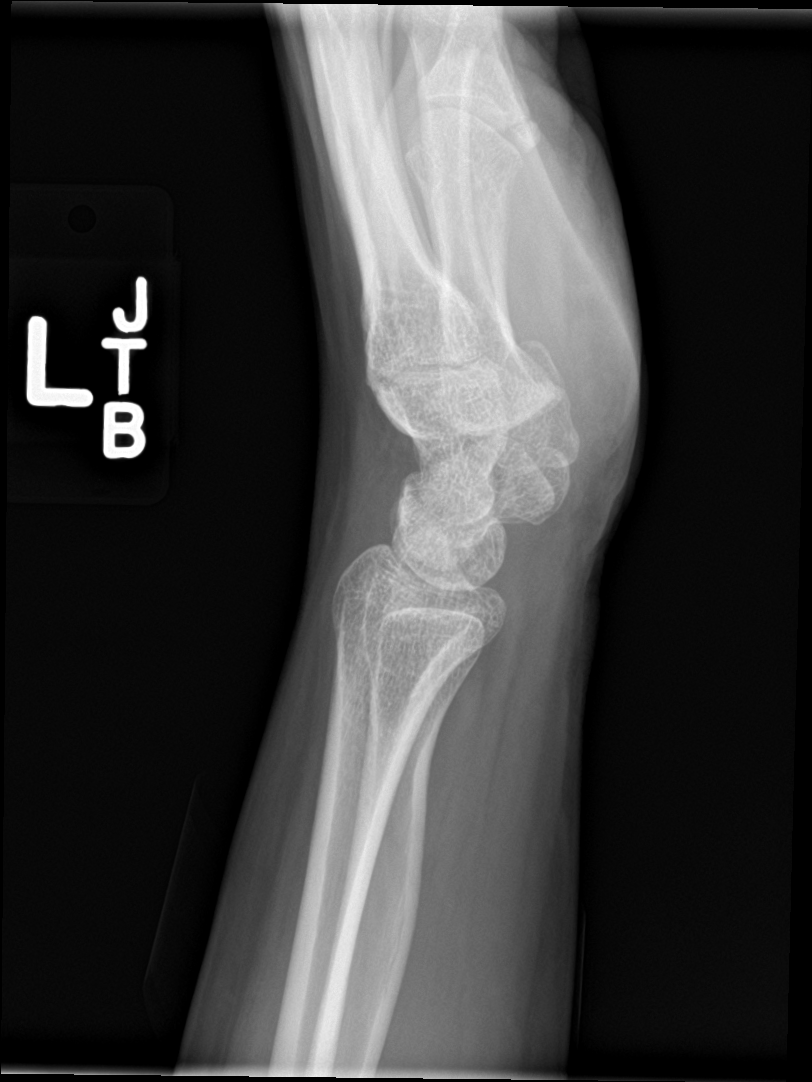

[wrist navicular]
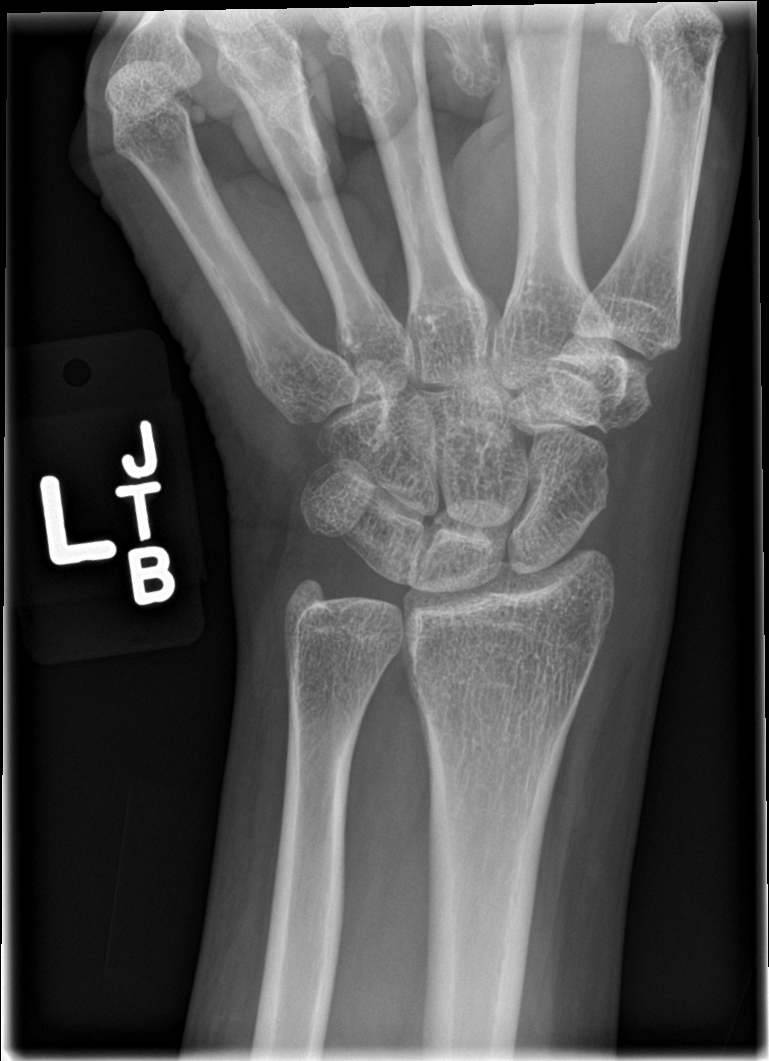

[4 of 4 positions shown; findings below may reference images not displayed]

FINDINGS: There is no evidence of fracture or dislocation. There is no
evidence of arthropathy or other focal bone abnormality. Soft
tissues are unremarkable.
IMPRESSION: Negative.

## 2024-02-19 ENCOUNTER — Encounter: Payer: Self-pay | Admitting: Physician Assistant

## 2024-02-19 ENCOUNTER — Other Ambulatory Visit: Payer: Self-pay

## 2024-02-19 ENCOUNTER — Ambulatory Visit: Admitting: Physician Assistant

## 2024-02-19 VITALS — BP 144/89 | HR 73 | Ht 66.0 in | Wt 159.0 lb

## 2024-02-19 DIAGNOSIS — R748 Abnormal levels of other serum enzymes: Secondary | ICD-10-CM

## 2024-02-19 DIAGNOSIS — E559 Vitamin D deficiency, unspecified: Secondary | ICD-10-CM | POA: Diagnosis not present

## 2024-02-19 DIAGNOSIS — I10 Essential (primary) hypertension: Secondary | ICD-10-CM

## 2024-02-19 DIAGNOSIS — E1165 Type 2 diabetes mellitus with hyperglycemia: Secondary | ICD-10-CM

## 2024-02-19 DIAGNOSIS — Z7984 Long term (current) use of oral hypoglycemic drugs: Secondary | ICD-10-CM

## 2024-02-19 DIAGNOSIS — E782 Mixed hyperlipidemia: Secondary | ICD-10-CM

## 2024-02-19 DIAGNOSIS — Z13220 Encounter for screening for lipoid disorders: Secondary | ICD-10-CM | POA: Diagnosis not present

## 2024-02-19 LAB — HEMOGLOBIN A1C: Hemoglobin A1C: 6.7

## 2024-02-19 LAB — AMB RESULTS CONSOLE CBG: Glucose: 132

## 2024-02-19 MED ORDER — BLOOD PRESSURE CUFF MISC: 1.0000 [IU] | Freq: Every day | 0 refills | Status: AC

## 2024-02-19 MED ORDER — ACCU-CHEK AVIVA PLUS W/DEVICE KIT
1.0000 [IU] | PACK | Freq: Once | 0 refills | Status: AC
  Filled 2024-02-19: qty 1, 30d supply, fill #0

## 2024-02-19 MED ORDER — ACCU-CHEK SOFTCLIX LANCETS MISC
1.0000 | Freq: Every day | 12 refills | Status: AC
  Filled 2024-02-19: qty 100, 50d supply, fill #0
  Filled 2024-03-27: qty 100, 100d supply, fill #1
  Filled 2024-04-01: qty 100, 50d supply, fill #1
  Filled 2024-05-15: qty 100, 50d supply, fill #2
  Filled 2024-07-13: qty 100, 50d supply, fill #3
  Filled 2024-08-26: qty 100, 50d supply, fill #4
  Filled 2024-10-15: qty 100, 50d supply, fill #5
  Filled 2024-12-04: qty 100, 50d supply, fill #6

## 2024-02-19 MED ORDER — ACCU-CHEK AVIVA PLUS VI STRP
1.0000 | ORAL_STRIP | 12 refills | Status: AC | PRN
  Filled 2024-02-19: qty 100, 50d supply, fill #0
  Filled 2024-03-27: qty 100, 90d supply, fill #1
  Filled 2024-04-01: qty 50, 50d supply, fill #1
  Filled 2024-04-23: qty 100, 100d supply, fill #2
  Filled 2024-05-15: qty 100, 90d supply, fill #2
  Filled 2024-07-30: qty 100, 90d supply, fill #3
  Filled 2024-10-13: qty 100, 90d supply, fill #4
  Filled 2024-12-07 – 2024-12-21 (×2): qty 100, 90d supply, fill #5

## 2024-02-19 MED ORDER — METFORMIN HCL ER 500 MG PO TB24
500.0000 mg | ORAL_TABLET | Freq: Two times a day (BID) | ORAL | 1 refills | Status: DC
  Filled 2024-02-19: qty 60, 30d supply, fill #0

## 2024-02-19 MED ORDER — LISINOPRIL 5 MG PO TABS
5.0000 mg | ORAL_TABLET | Freq: Every day | ORAL | 3 refills | Status: DC
  Filled 2024-02-19: qty 90, 90d supply, fill #0

## 2024-02-19 NOTE — Patient Instructions (Addendum)
 VISIT SUMMARY:  During your visit, we discussed your recent elevated A1c level of 6.7%, which confirms a diagnosis of Type 2 Diabetes Mellitus. We also addressed your blood pressure reading of 144/89 mmHg, indicating hypertension. We reviewed your current health status, lifestyle, and family history, and provided a comprehensive plan to manage your conditions.  YOUR PLAN:  -TYPE 2 DIABETES MELLITUS: Type 2 Diabetes Mellitus is a condition where your body does not use insulin properly, leading to high blood sugar levels. To manage this, we have prescribed Metformin 500 mg to be taken twice daily. You will also receive a blood glucose monitor kit and should check your blood sugar every morning, before meals, and before bedtime. Additionally, we recommend a healthy diet and regular exercise. .  -HYPERTENSION: Hypertension, or high blood pressure, can lead to serious health issues if not managed properly. Your blood pressure reading was 144/89 mmHg. You will start taking lisinopril 5 mg once daily.  You will receive a blood pressure monitor kit and should regularly check and log your blood pressure. Maintaining a healthy diet and regular exercise is also important.  -GENERAL HEALTH MAINTENANCE: Maintaining your overall health is crucial. We recommend 30 minutes of brisk walking or exercise five times a week and continuing a healthy diet with limited sugar and salt intake.  Carbohydrate Counting for Diabetes Mellitus, Adult Carbohydrate counting is a method of keeping track of how many carbohydrates you eat. Eating carbohydrates increases the amount of sugar (glucose) in the blood. Counting how many carbohydrates you eat improves how well you manage your blood glucose. This, in turn, helps you manage your diabetes. Carbohydrates are measured in grams (g) per serving. It is important to know how many carbohydrates (in grams or by serving size) you can have in each meal. This is different for every person. A  dietitian can help you make a meal plan and calculate how many carbohydrates you should have at each meal and snack. What foods contain carbohydrates? Carbohydrates are found in the following foods: Grains, such as breads and cereals. Dried beans and soy products. Starchy vegetables, such as potatoes, peas, and corn. Fruit and fruit juices. Milk and yogurt. Sweets and snack foods, such as cake, cookies, candy, chips, and soft drinks. How do I count carbohydrates in foods? There are two ways to count carbohydrates in food. You can read food labels or learn standard serving sizes of foods. You can use either of these methods or a combination of both. Using the Nutrition Facts label The Nutrition Facts list is included on the labels of almost all packaged foods and beverages in the [place]. It includes: The serving size. Information about nutrients in each serving, including the grams of carbohydrate per serving. To use the Nutrition Facts, decide how many servings you will have. Then, multiply the number of servings by the number of carbohydrates per serving. The resulting number is the total grams of carbohydrates that you will be having. Learning the standard serving sizes of foods When you eat carbohydrate foods that are not packaged or do not include Nutrition Facts on the label, you need to measure the servings in order to count the grams of carbohydrates. Measure the foods that you will eat with a food scale or measuring cup, if needed. Decide how many standard-size servings you will eat. Multiply the number of servings by 15. For foods that contain carbohydrates, one serving equals 15 g of carbohydrates. For example, if you eat 2 cups or 10 oz (  300 g) of strawberries, you will have eaten 2 servings and 30 g of carbohydrates (2 servings x 15 g = 30 g). For foods that have more than one food mixed, such as soups and casseroles, you must count the carbohydrates in each food that is  included. The following list contains standard serving sizes of common carbohydrate-rich foods. Each of these servings has about 15 g of carbohydrates: 1 slice of bread. 1 six-inch (15 cm) tortilla. ? cup or 2 oz (53 g) cooked rice or pasta.  cup or 3 oz (85 g) cooked or canned, drained and rinsed beans or lentils.  cup or 3 oz (85 g) starchy vegetable, such as peas, corn, or squash.  cup or 4 oz (120 g) hot cereal.  cup or 3 oz (85 g) boiled or mashed potatoes, or  or 3 oz (85 g) of a large baked potato.  cup or 4 fl oz (118 mL) fruit juice. 1 cup or 8 fl oz (237 mL) milk. 1 small or 4 oz (106 g) apple.  or 2 oz (63 g) of a medium banana. 1 cup or 5 oz (150 g) strawberries. 3 cups or 1 oz (28.3 g) popped popcorn. What is an example of carbohydrate counting? To calculate the grams of carbohydrates in this sample meal, follow the steps shown below. Sample meal 3 oz (85 g) chicken breast. ? cup or 4 oz (106 g) brown rice.  cup or 3 oz (85 g) corn. 1 cup or 8 fl oz (237 mL) milk. 1 cup or 5 oz (150 g) strawberries with sugar-free whipped topping. Carbohydrate calculation Identify the foods that contain carbohydrates: Rice. Corn. Milk. Strawberries. Calculate how many servings you have of each food: 2 servings rice. 1 serving corn. 1 serving milk. 1 serving strawberries. Multiply each number of servings by 15 g: 2 servings rice x 15 g = 30 g. 1 serving corn x 15 g = 15 g. 1 serving milk x 15 g = 15 g. 1 serving strawberries x 15 g = 15 g. Add together all of the amounts to find the total grams of carbohydrates eaten: 30 g + 15 g + 15 g + 15 g = 75 g of carbohydrates total. What are tips for following this plan? Shopping Develop a meal plan and then make a shopping list. Buy fresh and frozen vegetables, fresh and frozen fruit, dairy, eggs, beans, lentils, and whole grains. Look at food labels. Choose foods that have more fiber and less sugar. Avoid processed foods and  foods with added sugars. Meal planning Aim to have the same number of grams of carbohydrates at each meal and for each snack time. Plan to have regular, balanced meals and snacks. Where to find more information American Diabetes Association: diabetes.org Centers for Disease Control and Prevention: TonerPromos.no Academy of Nutrition and Dietetics: eatright.org Association of Diabetes Care & Education Specialists: diabeteseducator.org Summary Carbohydrate counting is a method of keeping track of how many carbohydrates you eat. Eating carbohydrates increases the amount of sugar (glucose) in your blood. Counting how many carbohydrates you eat improves how well you manage your blood glucose. This helps you manage your diabetes. A dietitian can help you make a meal plan and calculate how many carbohydrates you should have at each meal and snack. This information is not intended to replace advice given to you by your health care provider. Make sure you discuss any questions you have with your health care provider. Document Revised: 06/14/2020 Document Reviewed: 06/15/2020  Elsevier Patient Education  2024 [employer].

## 2024-02-19 NOTE — Progress Notes (Signed)
 Pt screened for HTN, non fasting blood glucose and A1C. Negative SDOH. BP was taken times two and second reading in the normal range. Educated pt on normal BP readings and the importance of checkinh her BP on a regular basis. Non fasting blood sugar was within the normal range. Pt requested A1C to be checked because she has been ' told to watch her diet for DM" A1C was found to be elevated. Educated pt on healthy life style and diet changes. Educational material given to pt. Escorted pt to the [facility] for evaluation.

## 2024-02-19 NOTE — Progress Notes (Signed)
 New Patient Office Visit  Subjective   Patient ID: Crystal Doyle, female    DOB: Feb 14, 1971  Age: 53 y.o. MRN: [id number]  Chief Complaint  Patient presents with   elevated A1c levels     Patient was seen by our Screening unit first, and presents to the Unit with Elevated A1c   Discussed the use of AI scribe software for clinical note transcription with the patient, who gave verbal consent to proceed.  History of Present Illness   The patient is a female who was called in for a check-up due to an elevated A1c level of 6.7%. She denies experiencing any symptoms of diabetes such as increased thirst, increased eating, or increased urination. She also denies any recent weight changes, fatigue, weakness, or blurry vision. However, she reports feeling tired due to fasting for Ramadan. She has a history of blurry vision and wears corrective lenses. She denies any numbness, tingling, weakness, or delayed wound healing. She has no history of high blood pressure, high cholesterol, polycystic ovarian syndrome, heart disease, skin infection, urinary tract infection, fungal infection, or thyroid disease. She takes a women's 50+ multivitamin once a day and Tylenol as needed for headaches. She has a family history of diabetes and hypertension. She follows a healthy diet and does regular physical activity. She does not smoke, drink alcohol, or use recreational drugs. She sleeps for about seven hours per night and feels well-rested upon waking. She denies any constipation, snoring, or sleep apnea. She has no pain or swelling in her legs.   Results LABS A1c: 6.7% (02/19/2024)     Past Medical History:  Diagnosis Date   Known health problems: none    Social History   Socioeconomic History   Marital status: Married    Spouse name: Adil   Number of children: 0   Years of education: college   Highest education level: Not on file  Occupational History   Occupation: Ticketing    Comment: [employer]   Tobacco Use   Smoking status: Never   Smokeless tobacco: Never  Vaping Use   Vaping status: Never Used  Substance and Sexual Activity   Alcohol use: No   Drug use: No   Sexual activity: Not on file  Other Topics Concern   Not on file  Social History Narrative   From [place].  Came to the [place] in 2004. Lives with her husband. Completed High school education in [place]   Social Drivers of Health   Financial Resource Strain: Not on file  Food Insecurity: No Food Insecurity (02/19/2024)   Hunger Vital Sign    Worried About Running Out of Food in the Last Year: Never true    Ran Out of Food in the Last Year: Never true  Transportation Needs: No Transportation Needs (02/19/2024)   PRAPARE - Administrator, Civil Service (Medical): No    Lack of Transportation (Non-Medical): No  Physical Activity: Not on file  Stress: Not on file  Social Connections: Not on file  Intimate Partner Violence: Not At Risk (02/19/2024)   Humiliation, Afraid, Rape, and Kick questionnaire    Fear of Current or Ex-Partner: No    Emotionally Abused: No    Physically Abused: No    Sexually Abused: No   Family History  Problem Relation Age of Onset   Diabetes Brother    Kidney disease Neg Hx    No Known Allergies  Review of Systems  Constitutional:  Positive for malaise/fatigue. Negative for  weight loss.       Occasional fatigue with fasting and exertion only.  HENT:  Negative for congestion, ear discharge, ear pain, hearing loss and sore throat.   Eyes:  Positive for blurred vision. Negative for pain.       Reported blurry vision and wears corrective eye glasses with improvement of vision. Last eye exam 2 years ago and encouraged to visit eye doctor for dilated eye exam.   Respiratory:  Negative for cough.   Cardiovascular:  Negative for chest pain, palpitations, orthopnea, leg swelling and PND.  Gastrointestinal:  Negative for abdominal pain, blood in stool, constipation, diarrhea, melena,  nausea and vomiting.  Genitourinary:  Negative for dysuria, frequency, hematuria and urgency.  Musculoskeletal:  Negative for joint pain and myalgias.  Skin:  Negative for itching and rash.  Neurological:  Positive for headaches. Negative for tingling, tremors, sensory change and weakness.  Psychiatric/Behavioral:  Negative for depression and substance abuse. The patient is not nervous/anxious and does not have insomnia.       Objective:     BP (!) 144/89 (BP Location: Left Arm, Patient Position: Sitting, Cuff Size: Large)   Pulse 73   Ht 5\' 6"  (1.676 m)   Wt 159 lb (72.1 kg)   LMP 11/02/2013 Comment: lifelong irregular menses  SpO2 94%   BMI 25.66 kg/m  BP Readings from Last 3 Encounters:  02/19/24 (!) 144/89  02/19/24 129/79  12/14/20 (!) 143/89   Wt Readings from Last 3 Encounters:  02/19/24 159 lb (72.1 kg)  12/14/20 156 lb 3.2 oz (70.9 kg)  08/05/20 161 lb 3.2 oz (73.1 kg)    Physical Exam Vitals and nursing note reviewed.   GENERAL: Alert, cooperative, well developed, no acute distress HEENT: Normocephalic, atraumatic, normal oropharynx, moist mucous membranes, no cervical adenopathy, no thyromegaly, external auditory canals clear, tympanic membranes normal, retinal vessels intact, no hemorrhage, nicking, or exudates, nasal mucosa normal, no polyps, oral cavity normal, tongue without lesions,  good dentition, moist mucosa no ulcers or lesions. Neck: Supple no thyromegaly, LAD, carotid bruits B/L, or JVD. CHEST: Clear to auscultation bilaterally, no wheezes, rhonchi, or crackles, chest symmetric, normal chest excursion, non-tender rib cage. CARDIOVASCULAR: Normal heart sound regular rate and rhythm, S1 and S2 normal without murmurs, rubs, or gallops. no JVD, no carotid bruit b/l. ABDOMEN: Soft, rounded, non-tender, no rebound or guarding, non-distended, without organomegaly, normal bowel sounds x 4Q, liver palpable at the costal margin, non-tender, no splenomegaly, no  masses. EXTREMITIES: No clubbing, cyanosis, or edema MUSCULOSKELETAL: full active and passive ROM, 5/5 strength in all extremities NEUROLOGICAL: Awake and alert, cranial nerves grossly intact, moves all extremities without gross motor or sensory deficit   Assessment & Plan:   Problem List Items Addressed This Visit   None Visit Diagnoses       Type 2 diabetes mellitus with hyperglycemia, without long-term current use of insulin (HCC)    -  Primary     Screening, lipid         Vitamin D deficiency          Assessment and Plan Type 2 Diabetes Mellitus A1c at 6.7% confirms diagnosis. Blurry vision reported. Discussed diabetes control to prevent retinopathy, nephropathym neuropathy, MI, and stroke as complications of uncontrolled DM. Metformin chosen for glucose control. - Prescribe Metformin 500 mg twice daily. - Refer to ophthalmologist for annual dilated eye exam. - Provide blood glucose monitor kit. - Advise monitoring blood glucose every morning, before meals, and before bedtime. -  Emphasize healthy diet and regular exercise.  Hypertension Blood pressure at 144/89 mmHg. Discussed need for control of blood pressure with ACEI a renoprotective agent in the setting of DM. Discussed starting ACE inhibitor Lisinopril 5 mg daily and patient agrees. - Discuss adherence with ACE inhibitor for blood pressure control. - Provided prescription for blood pressure monitor kit. - Advise regular blood pressure monitoring and log keeping.  General Health Maintenance Emphasized regular exercise and balanced diet for diabetes management and overall health. - Advise 30 minutes of brisk walking or exercise five times a week. - Encourage continuation of diabetic healthy diet with limited sugar and salt intake.  Follow-up Discussed follow-up plans for diabetes and hypertension management. - Encouraged to self-schedule follow-up appointments as needed. - Encouraged to self-schedule eye exam for  dilated funduscopic exam - Bring blood glucose and blood pressure logs to each appointment. - Counseled about foot care and follow up as needed with PCP for sensation    testing and monofilament feet exam. Return in about 7 weeks (around 04/08/2024) for To establish PCP with Gwinda Passe.    Kasandra Knudsen Mayers, PA-C

## 2024-02-20 ENCOUNTER — Other Ambulatory Visit: Payer: Self-pay

## 2024-02-20 LAB — CBC WITH DIFFERENTIAL/PLATELET
Basophils Absolute: 0 10*3/uL (ref 0.0–0.2)
Basos: 1 %
EOS (ABSOLUTE): 0.1 10*3/uL (ref 0.0–0.4)
Eos: 3 %
Hematocrit: 43.8 % (ref 34.0–46.6)
Hemoglobin: 14.4 g/dL (ref 11.1–15.9)
Immature Grans (Abs): 0 10*3/uL (ref 0.0–0.1)
Immature Granulocytes: 0 %
Lymphocytes Absolute: 2.1 10*3/uL (ref 0.7–3.1)
Lymphs: 49 %
MCH: 28.2 pg (ref 26.6–33.0)
MCHC: 32.9 g/dL (ref 31.5–35.7)
MCV: 86 fL (ref 79–97)
Monocytes Absolute: 0.2 10*3/uL (ref 0.1–0.9)
Monocytes: 5 %
Neutrophils Absolute: 1.8 10*3/uL (ref 1.4–7.0)
Neutrophils: 42 %
Platelets: 303 10*3/uL (ref 150–450)
RBC: 5.1 x10E6/uL (ref 3.77–5.28)
RDW: 12.9 % (ref 11.7–15.4)
WBC: 4.3 10*3/uL (ref 3.4–10.8)

## 2024-02-20 LAB — COMP. METABOLIC PANEL (12)
AST: 29 IU/L (ref 0–40)
Albumin: 4.8 g/dL (ref 3.8–4.9)
Alkaline Phosphatase: 135 IU/L — ABNORMAL HIGH (ref 44–121)
BUN/Creatinine Ratio: 20 (ref 9–23)
BUN: 11 mg/dL (ref 6–24)
Bilirubin Total: <0.2 mg/dL (ref 0.0–1.2)
Calcium: 10.7 mg/dL — ABNORMAL HIGH (ref 8.7–10.2)
Chloride: 98 mmol/L (ref 96–106)
Creatinine, Ser: 0.56 mg/dL — ABNORMAL LOW (ref 0.57–1.00)
Globulin, Total: 3.4 g/dL (ref 1.5–4.5)
Glucose: 99 mg/dL (ref 70–99)
Potassium: 4.4 mmol/L (ref 3.5–5.2)
Sodium: 137 mmol/L (ref 134–144)
Total Protein: 8.2 g/dL (ref 6.0–8.5)
eGFR: 108 mL/min/{1.73_m2} (ref >59)

## 2024-02-20 LAB — MICROALBUMIN / CREATININE URINE RATIO
Creatinine, Urine: 48 mg/dL
Microalb/Creat Ratio: 50 mg/g{creat} — ABNORMAL HIGH (ref 0–29)
Microalbumin, Urine: 23.8 ug/mL

## 2024-02-20 LAB — LIPID PANEL
Chol/HDL Ratio: 5.8 ratio — ABNORMAL HIGH (ref 0.0–4.4)
Cholesterol, Total: 291 mg/dL — ABNORMAL HIGH (ref 100–199)
HDL: 50 mg/dL (ref >39)
LDL Chol Calc (NIH): 207 mg/dL — ABNORMAL HIGH (ref 0–99)
Triglycerides: 177 mg/dL — ABNORMAL HIGH (ref 0–149)
VLDL Cholesterol Cal: 34 mg/dL (ref 5–40)

## 2024-02-20 LAB — VITAMIN D 25 HYDROXY (VIT D DEFICIENCY, FRACTURES): Vit D, 25-Hydroxy: 31.8 ng/mL (ref 30.0–100.0)

## 2024-02-20 MED ORDER — ATORVASTATIN CALCIUM 10 MG PO TABS
10.0000 mg | ORAL_TABLET | Freq: Every day | ORAL | 2 refills | Status: DC
  Filled 2024-02-20: qty 30, 30d supply, fill #0

## 2024-02-20 NOTE — Addendum Note (Signed)
 Addended by: Roney Jaffe on: 02/20/2024 05:06 PM   Modules accepted: Orders

## 2024-02-21 ENCOUNTER — Other Ambulatory Visit: Payer: Self-pay

## 2024-03-06 ENCOUNTER — Ambulatory Visit (HOSPITAL_COMMUNITY)
Admission: RE | Admit: 2024-03-06 | Discharge: 2024-03-06 | Disposition: A | Discharge status: Home / Self Care | Source: Ambulatory Visit | Attending: Physician Assistant | Admitting: Physician Assistant

## 2024-03-06 DIAGNOSIS — R748 Abnormal levels of other serum enzymes: Secondary | ICD-10-CM | POA: Diagnosis not present

## 2024-03-12 ENCOUNTER — Telehealth: Payer: Self-pay

## 2024-03-12 ENCOUNTER — Ambulatory Visit: Admitting: Physician Assistant

## 2024-03-12 NOTE — Telephone Encounter (Signed)
 Patient called to schedule follow up appointment with the Mobile unit for Tuesday 4.22.2025. She was encouraged to be fasting in case labs are needed at the time of visit.

## 2024-03-17 ENCOUNTER — Other Ambulatory Visit: Payer: Self-pay

## 2024-03-17 ENCOUNTER — Encounter: Payer: Self-pay | Admitting: Physician Assistant

## 2024-03-17 ENCOUNTER — Ambulatory Visit: Admitting: Physician Assistant

## 2024-03-17 VITALS — BP 123/76 | HR 88 | Ht 66.0 in | Wt 159.0 lb

## 2024-03-17 DIAGNOSIS — K760 Fatty (change of) liver, not elsewhere classified: Secondary | ICD-10-CM

## 2024-03-17 DIAGNOSIS — E782 Mixed hyperlipidemia: Secondary | ICD-10-CM | POA: Diagnosis not present

## 2024-03-17 DIAGNOSIS — E1165 Type 2 diabetes mellitus with hyperglycemia: Secondary | ICD-10-CM | POA: Diagnosis not present

## 2024-03-17 DIAGNOSIS — Z1231 Encounter for screening mammogram for malignant neoplasm of breast: Secondary | ICD-10-CM

## 2024-03-17 DIAGNOSIS — I10 Essential (primary) hypertension: Secondary | ICD-10-CM | POA: Diagnosis not present

## 2024-03-17 MED ORDER — LISINOPRIL 5 MG PO TABS
5.0000 mg | ORAL_TABLET | Freq: Every day | ORAL | 1 refills | Status: DC
Start: 2024-03-17 — End: 2024-05-22
  Filled 2024-03-17 – 2024-05-15 (×2): qty 90, 90d supply, fill #0

## 2024-03-17 MED ORDER — METFORMIN HCL ER 500 MG PO TB24
500.0000 mg | ORAL_TABLET | Freq: Two times a day (BID) | ORAL | 1 refills | Status: DC
Start: 2024-03-17 — End: 2024-05-22
  Filled 2024-03-17: qty 180, 90d supply, fill #0

## 2024-03-17 MED ORDER — ATORVASTATIN CALCIUM 10 MG PO TABS
10.0000 mg | ORAL_TABLET | Freq: Every day | ORAL | 1 refills | Status: DC
  Filled 2024-03-17: qty 90, 90d supply, fill #0

## 2024-03-17 NOTE — Progress Notes (Signed)
 Established Patient Office Visit  Subjective   Patient ID: Crystal Doyle, female    DOB: 19-Nov-1971  Age: 53 y.o. MRN: [id number]  Chief Complaint  Patient presents with   Diabetes   Discussed the use of AI scribe software for clinical note transcription with the patient, who gave verbal consent to proceed.  History of Present Illness   The patient, with a history of diabetes and hypertension, has been monitoring her blood sugars and blood pressure daily. The patient reports that her blood sugars have been improving, and her blood pressure readings have been consistently within the normal range. The patient is currently on metformin  for diabetes and lisinopril  for hypertension, and reports no significant side effects from these medications. The patient also has elevated cholesterol levels and is on medication for this. The patient has been referred for a mammogram and has had an ultrasound which showed some fatty liver. The patient is also taking a multivitamin.    Past Medical History:  Diagnosis Date   Known health problems: none    Social History   Socioeconomic History   Marital status: Married    Spouse name: Adil   Number of children: 0   Years of education: college   Highest education level: Not on file  Occupational History   Occupation: Ticketing    Comment: [employer]  Tobacco Use   Smoking status: Never   Smokeless tobacco: Never  Vaping Use   Vaping status: Never Used  Substance and Sexual Activity   Alcohol use: No   Drug use: No   Sexual activity: Not on file  Other Topics Concern   Not on file  Social History Narrative   From [place].  Came to the [place]  in [place]. Lives with her husband. Completed High school education in [place]   Social Drivers of Health   Financial Resource Strain: Not on file  Food Insecurity: No Food Insecurity (02/19/2024)   Hunger Vital Sign    Worried About Running Out of Food in the Last Year: Never true    Ran Out of Food in the  Last Year: Never true  Transportation Needs: No Transportation Needs (02/19/2024)   PRAPARE - Administrator, Civil Service (Medical): No    Lack of Transportation (Non-Medical): No  Physical Activity: Not on file  Stress: Not on file  Social Connections: Unknown (04/10/2022)   Received from [facility]   Social Network    Social Network: Not on file  Intimate Partner Violence: Not At Risk (02/19/2024)   Humiliation, Afraid, Rape, and Kick questionnaire    Fear of Current or Ex-Partner: No    Emotionally Abused: No    Physically Abused: No    Sexually Abused: No   Family History  Problem Relation Age of Onset   Diabetes Brother    Kidney disease Neg Hx    No Known Allergies  Review of Systems  Constitutional: Negative.   HENT: Negative.    Eyes: Negative.   Respiratory:  Negative for shortness of breath.   Cardiovascular:  Negative for chest pain.  Gastrointestinal: Negative.   Genitourinary: Negative.   Musculoskeletal: Negative.   Skin: Negative.   Neurological: Negative.   Endo/Heme/Allergies: Negative.   Psychiatric/Behavioral: Negative.        Objective:     BP 123/76 (BP Location: Left Arm, Patient Position: Sitting, Cuff Size: Large)   Pulse 88   Ht 5\' 6"  (1.676 m)   Wt 159 lb (72.1 kg)  LMP 11/02/2013 Comment: lifelong irregular menses  BMI 25.66 kg/m  BP Readings from Last 3 Encounters:  03/17/24 123/76  02/19/24 (!) 144/89  02/19/24 129/79   Wt Readings from Last 3 Encounters:  03/17/24 159 lb (72.1 kg)  02/19/24 159 lb (72.1 kg)  12/14/20 156 lb 3.2 oz (70.9 kg)    Physical Exam Vitals and nursing note reviewed.  HENT:     Head: Normocephalic and atraumatic.     Right Ear: External ear normal.     Left Ear: External ear normal.     Nose: Nose normal.     Mouth/Throat:     Mouth: Mucous membranes are moist.     Pharynx: Oropharynx is clear.  Eyes:     Extraocular Movements: Extraocular movements intact.      Conjunctiva/sclera: Conjunctivae normal.     Pupils: Pupils are equal, round, and reactive to light.  Cardiovascular:     Rate and Rhythm: Normal rate.     Pulses: Normal pulses.     Heart sounds: Normal heart sounds.  Pulmonary:     Effort: Pulmonary effort is normal.     Breath sounds: Normal breath sounds.  Musculoskeletal:        General: Normal range of motion.     Cervical back: Normal range of motion and neck supple.  Skin:    General: Skin is warm and dry.  Neurological:     General: No focal deficit present.     Mental Status: She is oriented to person, place, and time.  Psychiatric:        Mood and Affect: Mood normal.        Behavior: Behavior normal.        Thought Content: Thought content normal.        Judgment: Judgment normal.          Assessment & Plan:   Problem List Items Addressed This Visit       Cardiovascular and Mediastinum   Essential hypertension   Relevant Medications   atorvastatin  (LIPITOR) 10 MG tablet   lisinopril  (ZESTRIL ) 5 MG tablet     Digestive   Fatty liver     Endocrine   Type 2 diabetes mellitus with hyperglycemia, without long-term current use of insulin (HCC) - Primary   Relevant Medications   atorvastatin  (LIPITOR) 10 MG tablet   lisinopril  (ZESTRIL ) 5 MG tablet   metFORMIN  (GLUCOPHAGE -XR) 500 MG 24 hr tablet     Other   Mixed hyperlipidemia   Relevant Medications   atorvastatin  (LIPITOR) 10 MG tablet   lisinopril  (ZESTRIL ) 5 MG tablet   Other Visit Diagnoses       Encounter for screening mammogram for malignant neoplasm of breast       Relevant Orders   MM 3D SCREENING MAMMOGRAM BILATERAL BREAST       RADIOLOGY Ultrasound: Hepatic steatosis (03/10/2024)  1. Type 2 diabetes mellitus with hyperglycemia, without long-term current use of insulin (HCC) (Primary) Type 2 diabetes is well-managed with metformin  and lifestyle modifications. Fasting blood sugar levels are improving.   Patient given appt to  establish care at [facility] - Continue metformin  twice daily. - metFORMIN  (GLUCOPHAGE -XR) 500 MG 24 hr tablet; Take 1 tablet (500 mg total) by mouth 2 (two) times daily with a meal.  Dispense: 180 tablet; Refill: 1  2. Essential hypertension Hypertension is well-controlled with current medication regimen. Blood pressure readings are consistently within normal range. - Continue current antihypertensive medication (lisinopril ). - Advise to check blood  pressure twice weekly - lisinopril  (ZESTRIL ) 5 MG tablet; Take 1 tablet (5 mg total) by mouth daily.  Dispense: 90 tablet; Refill: 1  3. Mixed hyperlipidemia Continue - atorvastatin  (LIPITOR) 10 MG tablet; Take 1 tablet (10 mg total) by mouth daily.  Dispense: 90 tablet; Refill: 1  4. Fatty liver Ultrasound shows fatty liver. Liver enzymes slightly abnormal. Current lifestyle modifications are appropriate for management. No medications required. - Recommend repeat ultrasound in one year to monitor liver condition.  5. Encounter for screening mammogram for malignant neoplasm of breast  - MM 3D SCREENING MAMMOGRAM BILATERAL BREAST; Future   I have reviewed the patient's medical history (PMH, PSH, Social History, Family History, Medications, and allergies) , and have been updated if relevant. I spent 30 minutes reviewing chart and  face to face time with patient.    Return in about 29 days (around 04/15/2024) for with Lavona Pounds, NP at Primary [facility] at [facility].    Etter Hermann Mayers, PA-C

## 2024-03-17 NOTE — Patient Instructions (Addendum)
 VISIT SUMMARY:  During your wellness visit, we reviewed your overall health and management of your chronic conditions. Your blood pressure and blood sugar levels are well-controlled, and you are adhering to your lifestyle modifications. We discussed your current medications and their effectiveness, and you reported no significant side effects. We also reviewed your recent ultrasound results and discussed the next steps for your health maintenance.  YOUR PLAN:  -WELLNESS VISIT: This was a routine wellness visit with no acute concerns. Your blood pressure and blood sugar levels are well-controlled, and you are adhering to lifestyle modifications. We have ordered a mammogram for breast cancer screening.  -ESSENTIAL (PRIMARY) HYPERTENSION: Hypertension means high blood pressure. Your blood pressure is well-controlled with your current medication, lisinopril . Please continue taking lisinopril  as prescribed and check your blood pressure twice weekly.  -TYPE 2 DIABETES MELLITUS WITHOUT COMPLICATIONS: Type 2 diabetes is a condition where your body does not use insulin properly, leading to high blood sugar levels. Your diabetes is well-managed with metformin  and lifestyle modifications. Please continue taking metformin  twice daily and check your blood sugar once or twice weekly, preferably in the morning before eating. We have provided refills for your diabetes medication and test strips.  -HYPERLIPIDEMIA, UNSPECIFIED: Hyperlipidemia means having high levels of cholesterol in your blood. Your condition is managed with cholesterol medication, and you are adhering to the treatment without side effects. Please continue taking your current cholesterol medication. We have provided refills for your medication.  -NONALCOHOLIC FATTY LIVER DISEASE: Nonalcoholic fatty liver disease is a condition where fat builds up in your liver without alcohol being a cause. Your ultrasound showed fatty liver, and your liver enzymes  are slightly abnormal. Your current lifestyle modifications are appropriate for managing this condition. We recommend a repeat ultrasound in one year to monitor your liver condition.  INSTRUCTIONS:  Please follow up with the mammogram as ordered. Continue monitoring your blood pressure twice weekly and your blood sugar once or twice weekly. We will repeat the ultrasound in one year to monitor your liver condition. If you have any new symptoms or concerns, please schedule an appointment.  Nonalcoholic Fatty Liver Disease Diet, Adult Nonalcoholic fatty liver disease is a condition that causes fat to build up in and around the liver. The disease makes it harder for the liver to work the way that it should. Eating a healthy diet of fruits, vegetables, whole grains, lean proteins, and limiting added sugar and fats can help to keep nonalcoholic fatty liver disease under control. It can also help to prevent or improve conditions that are related to the disease, such as heart disease, diabetes, high blood pressure, obesity, and high cholesterol. Along with regular exercise, this diet: Promotes weight loss. Helps to control blood sugar levels. Helps to improve the way that the body uses insulin. What are tips for following this plan? Reading food labels Always check food labels for: The amount of saturated fat in a food. You should limit how much saturated fat you eat. Saturated fat is found in foods that come from animals, including meat and dairy products such as butter, cheese, and whole milk. The amount of fiber in a food. You should choose high-fiber foods such as fruits, vegetables, and whole grains. Try to get 25-30 grams (g) of fiber a day. Added sugar. Avoid foods with a high amount of added sugar and high fructose corn syrup. Avoid sweetened soft drinks, sweetened tea, lemonade, sports drinks, and juices that are not 100% juice. Aim for foods with  less than 5 grams of added sugar. Every 4 grams  of added sugar is 1 teaspoon (tsp) of sugar per serving. Cooking When cooking, use heart-healthy oils that are high in monounsaturated fats. These include olive oil, canola oil, and avocado oil. Limit frying or deep-frying foods. Cook foods using healthy methods such as baking, boiling, steaming, and grilling instead. Meal planning You may want to keep track of how many calories you eat and drink. Eating the right amount of calories will help you achieve a healthy weight. Meeting with a dietitian can help you get started. Limit how often you eat takeout and fast food. These foods are usually very high in fat, salt, and sugar. Use the glycemic index (GI) to plan your meals. The index tells you how quickly a food will raise your blood sugar. Choose low-GI foods. Low-GI foods have a GI less than 55. These foods take a longer time to raise blood sugar. A dietitian can help you pick foods that are lower on the GI scale. Try to include some meals each week that replace meat with beans or legumes. Add fish 2-3 times a week, especially heart healthy oily fishes like salmon, sardines, trout, tuna, or mackerel. Lifestyle You may want to follow a Mediterranean diet. This diet includes a lot of vegetables, lean meats or fish, nuts and seeds, whole grains, fruits, and healthy oils and fats. What foods can I eat?  Fruits Apples. Bananas. Pears. Grapes. Papaya. Plums. Kiwi. Grapefruit. Cherries. Strawberries. Vegetables Lettuce. Spinach. Peas. Beets. Cauliflower. Cabbage. Broccoli. Carrots. Tomatoes. Squash. Eggplant. Herbs. Peppers. Onions. Cucumbers. Brussels sprouts. Yams and sweet potatoes. Grains Whole wheat or whole-grain foods, including breads, crackers, cereals, and pasta. Stone-ground whole wheat. Unsweetened oatmeal. Bulgur. Barley. Quinoa. Brown or wild rice. Corn or whole wheat flour tortillas. Meats and other proteins Lean meats. Poultry. Tofu. Seafood and shellfish. Beans.  Lentils. Dairy Low-fat or fat-free dairy products, such as yogurt, cottage cheese, or cheese. Beverages Water. Sugar-free drinks. Tea. Coffee. Low-fat or skim milk. Milk alternatives, such as unsweetened soy, oat, or almond milk. Real fruit juice. Fats and oils Avocado. Canola or olive oil. Nuts and nut butters. Seeds. Seasonings and condiments Mustard. Relish. Low-fat, low-sugar ketchup and barbecue sauce. Low-fat or fat-free mayonnaise. Sweets and desserts Sugar-free sweets. The items listed above may not be all the foods and drinks you can have. Talk to a dietitian to learn more. What foods should I limit or avoid? Grains White rice. Pasta. Breads. Meats and other proteins Limit red meat to 1-2 times a week. Dairy Full-fat dairy. Fats and oils Palm oil and coconut oil. Fried foods. Other foods Processed foods. Foods that contain a lot of salt (sodium) or added sugar. Sweets and desserts Sweets that contain sugar. Bakery items such as cookies, cakes, and other pastries. Beverages Sweetened drinks, such as sweet tea, milkshakes, iced sweet drinks, and sodas. Alcohol. The items listed above may not be all the foods and drinks you should avoid. Talk to a dietitian to learn more. Where to find more information The [employer] of Diabetes and Digestive and Kidney Diseases: [employer] This information is not intended to replace advice given to you by your health care provider. Make sure you discuss any questions you have with your health care provider. Document Revised: 08/27/2022 Document Reviewed: 08/27/2022 Elsevier Patient Education  2024 [employer].

## 2024-03-18 ENCOUNTER — Encounter: Payer: Self-pay | Admitting: Physician Assistant

## 2024-03-18 ENCOUNTER — Other Ambulatory Visit: Payer: Self-pay

## 2024-03-18 DIAGNOSIS — K760 Fatty (change of) liver, not elsewhere classified: Secondary | ICD-10-CM | POA: Insufficient documentation

## 2024-03-18 DIAGNOSIS — E1165 Type 2 diabetes mellitus with hyperglycemia: Secondary | ICD-10-CM | POA: Insufficient documentation

## 2024-03-18 DIAGNOSIS — I10 Essential (primary) hypertension: Secondary | ICD-10-CM | POA: Insufficient documentation

## 2024-03-18 DIAGNOSIS — E782 Mixed hyperlipidemia: Secondary | ICD-10-CM | POA: Insufficient documentation

## 2024-03-26 NOTE — Progress Notes (Signed)
 Patient attended screening event at [facility] on 02/19/2024 where Pt B/P was 129/79, Blood Glucose 132 non-fasting, Hemoglobin A1C 6.7. At the event pt did not document insurance coverage. Patient documented no for smoking and pt did not live with someone who smokes. Patient did not have any SDOH insecurities. Pt did not document PCP at the event. At the event pt was given educational material and pt was referred to [facility] for diabetes mellitus evaluation by clinician at the event.  Chart review indicates pt has been seen by Malcom Scriver, PA-C with [facility] on 02/19/2024 for recent elevated Hemoglobin A1C level that was taken at the screening event. At the office visit on 02/19/2024 pt B/P was 144/89 at the appt, pt was started on Metformin  50omg on 02/19/2024. Patient was also seen on 03/17/2024 by [facility] for Diabetes. Patient problem list in [facility] is Diabetes and Hypertension. Patient is currently on At Atorvastatin  (Lipitor), Lisinopril  and Metformin  to manage chronic conditions. Chart review does not indicate any SDOH insecurities, pt has Medicaid coverage. Chart review indicates pt has future appt with [facility] at [facility] with Senaida Dama, NP to establish care with PCP on 04/15/2024. Additional pt f/u to be scheduled per health equity protocol.

## 2024-03-27 ENCOUNTER — Other Ambulatory Visit: Payer: Self-pay

## 2024-04-01 ENCOUNTER — Ambulatory Visit
Admission: RE | Admit: 2024-04-01 | Discharge: 2024-04-01 | Disposition: A | Discharge status: Home / Self Care | Source: Ambulatory Visit | Attending: Physician Assistant

## 2024-04-01 ENCOUNTER — Other Ambulatory Visit: Payer: Self-pay

## 2024-04-01 DIAGNOSIS — Z1231 Encounter for screening mammogram for malignant neoplasm of breast: Secondary | ICD-10-CM | POA: Diagnosis not present

## 2024-04-03 ENCOUNTER — Other Ambulatory Visit: Payer: Self-pay | Admitting: Physician Assistant

## 2024-04-03 DIAGNOSIS — R928 Other abnormal and inconclusive findings on diagnostic imaging of breast: Secondary | ICD-10-CM

## 2024-04-08 ENCOUNTER — Ambulatory Visit: Payer: Self-pay

## 2024-04-08 ENCOUNTER — Ambulatory Visit (INDEPENDENT_AMBULATORY_CARE_PROVIDER_SITE_OTHER): Payer: Self-pay | Admitting: Primary Care

## 2024-04-10 ENCOUNTER — Other Ambulatory Visit: Payer: Self-pay | Admitting: Physician Assistant

## 2024-04-10 ENCOUNTER — Ambulatory Visit
Admission: RE | Admit: 2024-04-10 | Discharge: 2024-04-10 | Disposition: A | Discharge status: Home / Self Care | Source: Ambulatory Visit | Attending: Physician Assistant | Admitting: Physician Assistant

## 2024-04-10 DIAGNOSIS — R928 Other abnormal and inconclusive findings on diagnostic imaging of breast: Secondary | ICD-10-CM

## 2024-04-10 DIAGNOSIS — R921 Mammographic calcification found on diagnostic imaging of breast: Secondary | ICD-10-CM | POA: Diagnosis not present

## 2024-04-15 ENCOUNTER — Encounter: Payer: Self-pay | Admitting: Family

## 2024-04-15 ENCOUNTER — Ambulatory Visit (INDEPENDENT_AMBULATORY_CARE_PROVIDER_SITE_OTHER): Admitting: Family

## 2024-04-15 VITALS — BP 137/93 | HR 81 | Ht 66.0 in | Wt 159.8 lb

## 2024-04-15 DIAGNOSIS — I10 Essential (primary) hypertension: Secondary | ICD-10-CM

## 2024-04-15 DIAGNOSIS — E1165 Type 2 diabetes mellitus with hyperglycemia: Secondary | ICD-10-CM | POA: Diagnosis not present

## 2024-04-15 DIAGNOSIS — E119 Type 2 diabetes mellitus without complications: Secondary | ICD-10-CM

## 2024-04-15 DIAGNOSIS — Z603 Acculturation difficulty: Secondary | ICD-10-CM

## 2024-04-15 DIAGNOSIS — E785 Hyperlipidemia, unspecified: Secondary | ICD-10-CM

## 2024-04-15 DIAGNOSIS — Z7984 Long term (current) use of oral hypoglycemic drugs: Secondary | ICD-10-CM | POA: Diagnosis not present

## 2024-04-15 DIAGNOSIS — K760 Fatty (change of) liver, not elsewhere classified: Secondary | ICD-10-CM | POA: Diagnosis not present

## 2024-04-15 DIAGNOSIS — Z7689 Persons encountering health services in other specified circumstances: Secondary | ICD-10-CM

## 2024-04-15 NOTE — Progress Notes (Signed)
 Subjective:    Crystal Doyle - 53 y.o. female MRN [id number]  Date of birth: 04-Feb-1971  HPI  Crystal Doyle is to establish care.   Current issues and/or concerns: - Doing well on Lisinopril , no issues/concerns. Home blood pressures at goal. She does not complain of red flag symptoms such as but not limited to chest pain, shortness of breath, worst headache of life, nausea/vomiting.  - Doing well on Metformin  XR, no issues/concerns. Denies red flag symptoms associated with diabetes.  - Due for diabetic eye exam.  - Due for diabetic foot exam. - Doing well on Atorvastatin , no issues/concerns.  - Fatty liver. - States mammogram has been completed. - No further issues/concerns for discussion today.   ROS per HPI     Health Maintenance:  Health Maintenance Due  Topic Date Due   FOOT EXAM  Never done   OPHTHALMOLOGY EXAM  Never done   HIV Screening  Never done   Hepatitis C Screening  Never done   DTaP/Tdap/Td (1 - Tdap) Never done   Pneumococcal Vaccine 27-67 Years old (1 of 2 - PCV) Never done   Cervical Cancer Screening (HPV/Pap Cotest)  Never done   Colonoscopy  Never done   Zoster Vaccines- Shingrix (1 of 2) Never done   COVID-19 Vaccine (1 - 2024-25 season) Never done     Past Medical History: Patient Active Problem List   Diagnosis Date Noted   Type 2 diabetes mellitus with hyperglycemia, without long-term current use of insulin (HCC) 03/18/2024   Essential hypertension 03/18/2024   Mixed hyperlipidemia 03/18/2024   Fatty liver 03/18/2024   Blurry vision, bilateral 05/16/2016   Preventative health care 05/16/2016      Social History   reports that she has never smoked. She has never used smokeless tobacco. She reports that she does not drink alcohol and does not use drugs.   Family History  family history includes Diabetes in her brother.   Medications: reviewed and updated   Objective:   Physical Exam BP (!) 137/93 (BP Location: Right Arm,  Patient Position: Sitting, Cuff Size: Normal)   Pulse 81   Ht 5\' 6"  (1.676 m)   Wt 159 lb 12.8 oz (72.5 kg)   LMP 11/02/2013 Comment: lifelong irregular menses  SpO2 95%   BMI 25.79 kg/m   Physical Exam HENT:     Head: Normocephalic and atraumatic.     Nose: Nose normal.     Mouth/Throat:     Mouth: Mucous membranes are moist.     Pharynx: Oropharynx is clear.  Eyes:     Extraocular Movements: Extraocular movements intact.     Conjunctiva/sclera: Conjunctivae normal.     Pupils: Pupils are equal, round, and reactive to light.  Cardiovascular:     Rate and Rhythm: Normal rate and regular rhythm.     Pulses: Normal pulses.     Heart sounds: Normal heart sounds.  Pulmonary:     Effort: Pulmonary effort is normal.     Breath sounds: Normal breath sounds.  Musculoskeletal:        General: Normal range of motion.     Cervical back: Normal range of motion and neck supple.  Neurological:     General: No focal deficit present.     Mental Status: She is alert and oriented to person, place, and time.  Psychiatric:        Mood and Affect: Mood normal.        Behavior: Behavior normal.  Assessment & Plan:  1. Encounter to establish care (Primary) - Patient presents today to establish care. During the interim follow-up with primary provider as scheduled.  - Return for annual physical examination, labs, and health maintenance. Arrive fasting meaning having no food for at least 8 hours prior to appointment. You may have only water or black coffee. Please take scheduled medications as normal.  2. Primary hypertension - Blood pressure not at goal during today's visit. Mildly elevated. Patient asymptomatic without chest pressure, chest pain, palpitations, shortness of breath, worst headache of life, and any additional red flag symptoms. - Continue Lisinopril  as prescribed. No refills needed as of present.  - Counseled on blood pressure goal of less than 130/80, low-sodium, DASH diet,  medication compliance, and 150 minutes of moderate intensity exercise per week as tolerated. Counseled on medication adherence and adverse effects. - Follow-up with primary provider in 4 weeks or sooner if needed.  3. Type 2 diabetes mellitus with hyperglycemia, without long-term current use of insulin (HCC) - Hemoglobin A1c at goal 6.7% on 02/19/2024. Goal 7%.  - Continue Metformin  XR as prescribed. No refills needed as of present. - Discussed the importance of healthy eating habits, low-carbohydrate diet, low-sugar diet, regular aerobic exercise (at least 150 minutes a week as tolerated) and medication compliance to achieve or maintain control of diabetes. Counseled on medication adherence/adverse effects.  - Follow-up with primary provider in 4 weeks or sooner if needed.  4. Diabetic eye exam [facility]) - Referral to Ophthalmology for evaluation/management. - Ambulatory referral to Ophthalmology  5. Encounter for diabetic foot exam [facility]) - Referral to Podiatry for evaluation/management. - Ambulatory referral to Podiatry  6. Hyperlipidemia, unspecified hyperlipidemia type - Continue Atorvastatin  as prescribed. Counseled on medication adherence/adverse effects. No refills needed as of present.  - Follow-up with primary provider as scheduled.  7. Fatty liver - Referral to Gastroenterology for evaluation/management. - Ambulatory referral to Gastroenterology  8. Language barrier - Patient speaking English.    Patient was given clear instructions to go to Emergency Department or return to medical center if symptoms don't improve, worsen, or new problems develop.The patient verbalized understanding.  I discussed the assessment and treatment plan with the patient. The patient was provided an opportunity to ask questions and all were answered. The patient agreed with the plan and demonstrated an understanding of the instructions.   The patient was advised to call back or seek an in-person  evaluation if the symptoms worsen or if the condition fails to improve as anticipated.    Lavona Pounds, NP 04/15/2024, 12:26 PM Primary Care at [facility]

## 2024-04-23 ENCOUNTER — Ambulatory Visit
Admission: RE | Admit: 2024-04-23 | Discharge: 2024-04-23 | Disposition: A | Discharge status: Home / Self Care | Source: Ambulatory Visit | Attending: Physician Assistant

## 2024-04-23 ENCOUNTER — Ambulatory Visit
Admission: RE | Admit: 2024-04-23 | Discharge: 2024-04-23 | Disposition: A | Discharge status: Home / Self Care | Source: Ambulatory Visit | Attending: Physician Assistant | Admitting: Physician Assistant

## 2024-04-23 ENCOUNTER — Other Ambulatory Visit: Payer: Self-pay

## 2024-04-23 DIAGNOSIS — R92321 Mammographic fibroglandular density, right breast: Secondary | ICD-10-CM | POA: Diagnosis not present

## 2024-04-23 DIAGNOSIS — R921 Mammographic calcification found on diagnostic imaging of breast: Secondary | ICD-10-CM | POA: Diagnosis not present

## 2024-04-23 DIAGNOSIS — N6031 Fibrosclerosis of right breast: Secondary | ICD-10-CM | POA: Diagnosis not present

## 2024-04-23 HISTORY — PX: BREAST BIOPSY: SHX20

## 2024-04-24 LAB — SURGICAL PATHOLOGY

## 2024-05-04 ENCOUNTER — Encounter: Payer: Self-pay | Admitting: Podiatry

## 2024-05-04 ENCOUNTER — Ambulatory Visit (INDEPENDENT_AMBULATORY_CARE_PROVIDER_SITE_OTHER): Admitting: Podiatry

## 2024-05-04 DIAGNOSIS — L600 Ingrowing nail: Secondary | ICD-10-CM

## 2024-05-04 DIAGNOSIS — E119 Type 2 diabetes mellitus without complications: Secondary | ICD-10-CM

## 2024-05-04 NOTE — Progress Notes (Signed)
 Patient presents today with complaint of needing her affect checked for diabetes is concerned about some of the nails second on the right.  Does not complain of any burning or numbness in the feet.  Was diagnosed with diabetes about a month ago.  Has not noticed it has not complained of any infections in the past in her feet injuries.   Physical exam:  General appearance: Pleasant, and in no acute distress. AOx3.  Vascular: Pedal pulses: DP 2/4 bilaterally, PT 2/4 bilaterally.  Mild edema lower legs bilaterally. Capillary fill time immediate.  Neurological: Light touch intact feet bilaterally.  Normal Achilles reflex bilaterally.  No clonus or spasticity noted.  Monofilament testing within normal limits.  Slight diminishment in vibratory sensation.  Negative Tinel's sign tarsal tunnel, porta pedis, and peripheral nerves around the ankle bilaterally.  Dermatologic:   Thickened dystrophic second nails bilaterally.  Changes appear to be dystrophic Brugman fungal at this time.  Some tenderness with pressure on them.  Skin normal temperature bilaterally.  Skin normal color, tone, and texture bilaterally.   Musculoskeletal: Hammertoe second toe with elongation of the digit bilaterally.  Good range of motion ankle joint subtalar joint.  No tenderness to palpation  Radiographs: None  Diagnosis: 1.  Ingrown nail second toe bilaterally 2.  Diabetes mellitus type 2 with probable mild neuropathy  Plan: -New office patient visit level 3 for evaluation and management. - Discussed nails on the second toe.  Most likely these are just dystrophic nail from the elongated and hammered second toes.  If they do become more we can discuss options for that.  Discussed with her precautions she need to take as a diabetic regarding her feet.  Is discussed that she is probably did already develop some early neuropathy   Return as needed

## 2024-05-15 ENCOUNTER — Other Ambulatory Visit: Payer: Self-pay

## 2024-05-22 ENCOUNTER — Other Ambulatory Visit: Payer: Self-pay

## 2024-05-22 ENCOUNTER — Encounter: Payer: Self-pay | Admitting: Family

## 2024-05-22 ENCOUNTER — Ambulatory Visit (INDEPENDENT_AMBULATORY_CARE_PROVIDER_SITE_OTHER): Admitting: Family

## 2024-05-22 VITALS — BP 121/82 | HR 80 | Temp 98.6°F | Resp 16 | Ht 66.0 in | Wt 158.0 lb

## 2024-05-22 DIAGNOSIS — Z130 Encounter for screening for diseases of the blood and blood-forming organs and certain disorders involving the immune mechanism: Secondary | ICD-10-CM

## 2024-05-22 DIAGNOSIS — Z13228 Encounter for screening for other metabolic disorders: Secondary | ICD-10-CM | POA: Diagnosis not present

## 2024-05-22 DIAGNOSIS — Z1159 Encounter for screening for other viral diseases: Secondary | ICD-10-CM | POA: Diagnosis not present

## 2024-05-22 DIAGNOSIS — Z7984 Long term (current) use of oral hypoglycemic drugs: Secondary | ICD-10-CM | POA: Diagnosis not present

## 2024-05-22 DIAGNOSIS — Z1329 Encounter for screening for other suspected endocrine disorder: Secondary | ICD-10-CM

## 2024-05-22 DIAGNOSIS — E1165 Type 2 diabetes mellitus with hyperglycemia: Secondary | ICD-10-CM | POA: Diagnosis not present

## 2024-05-22 DIAGNOSIS — Z114 Encounter for screening for human immunodeficiency virus [HIV]: Secondary | ICD-10-CM | POA: Diagnosis not present

## 2024-05-22 DIAGNOSIS — E785 Hyperlipidemia, unspecified: Secondary | ICD-10-CM | POA: Diagnosis not present

## 2024-05-22 DIAGNOSIS — Z0000 Encounter for general adult medical examination without abnormal findings: Secondary | ICD-10-CM | POA: Diagnosis not present

## 2024-05-22 DIAGNOSIS — Z758 Other problems related to medical facilities and other health care: Secondary | ICD-10-CM

## 2024-05-22 DIAGNOSIS — Z603 Acculturation difficulty: Secondary | ICD-10-CM

## 2024-05-22 DIAGNOSIS — E119 Type 2 diabetes mellitus without complications: Secondary | ICD-10-CM

## 2024-05-22 DIAGNOSIS — Z124 Encounter for screening for malignant neoplasm of cervix: Secondary | ICD-10-CM

## 2024-05-22 DIAGNOSIS — I10 Essential (primary) hypertension: Secondary | ICD-10-CM | POA: Diagnosis not present

## 2024-05-22 DIAGNOSIS — Z1211 Encounter for screening for malignant neoplasm of colon: Secondary | ICD-10-CM

## 2024-05-22 MED ORDER — ATORVASTATIN CALCIUM 10 MG PO TABS
10.0000 mg | ORAL_TABLET | Freq: Every day | ORAL | 0 refills | Status: AC
Start: 2024-05-22 — End: ?
  Filled 2024-05-22 – 2024-06-15 (×2): qty 90, 90d supply, fill #0

## 2024-05-22 MED ORDER — LISINOPRIL 5 MG PO TABS
5.0000 mg | ORAL_TABLET | Freq: Every day | ORAL | 0 refills | Status: DC
  Filled 2024-05-22 – 2024-08-10 (×2): qty 90, 90d supply, fill #0

## 2024-05-22 MED ORDER — METFORMIN HCL ER 500 MG PO TB24
500.0000 mg | ORAL_TABLET | Freq: Two times a day (BID) | ORAL | 0 refills | Status: DC
Start: 2024-05-22 — End: 2024-09-09
  Filled 2024-05-22 – 2024-06-11 (×2): qty 180, 90d supply, fill #0

## 2024-05-22 NOTE — Progress Notes (Signed)
 Patient ID: Crystal Doyle, female    DOB: 1971-01-10  MRN: [id number]  CC: Annual Exam  Subjective: Crystal Doyle is a 53 y.o. female who presents for annual exam.  Her concerns today include:  - Per Care Gaps patient is up to date on mammogram. - Doing well on Lisinopril , no issues/concerns. She does not complain of red flag symptoms such as but not limited to chest pain, shortness of breath, worst headache of life, nausea/vomiting.  - Doing well on Metformin  XR, no issues/concerns. Denies red flag symptoms associated with diabetes.  - Due for diabetic eye exam.  - States she is up to date on diabetic foot exam. - Doing well on Atorvastatin , no issues/concerns.  Patient Active Problem List   Diagnosis Date Noted   Type 2 diabetes mellitus with hyperglycemia, without long-term current use of insulin (HCC) 03/18/2024   Essential hypertension 03/18/2024   Mixed hyperlipidemia 03/18/2024   Fatty liver 03/18/2024   Blurry vision, bilateral 05/16/2016   Preventative health care 05/16/2016     Current Outpatient Medications on File Prior to Visit  Medication Sig Dispense Refill   Accu-Chek Softclix Lancets lancets USE 1 each daily & as instructed. 100 each 12   B Complex Vitamins (VITAMIN-B COMPLEX PO) Take by mouth as needed.     Blood Pressure Monitoring (BLOOD PRESSURE CUFF) MISC 1 Units by Does not apply route daily. 1 each 0   Cholecalciferol (D3-1000 PO) Take 1 tablet by mouth daily.     diclofenac  sodium (VOLTAREN ) 1 % GEL Apply 2 g topically 4 (four) times daily.     glucose blood (ACCU-CHEK AVIVA PLUS) test strip USE 1 as needed & AS INSTRUCTED. 100 each 12   ibuprofen  (ADVIL ) 600 MG tablet Take 1 tablet (600 mg total) by mouth every 8 (eight) hours as needed. For pain; take after eating 30 tablet 0   Multiple Vitamin (MULTIVITAMIN) tablet Take 1 tablet by mouth daily.     Vitamin D , Ergocalciferol , (DRISDOL) 1.25 MG (50000 UNIT) CAPS capsule Take 50,000 Units by  mouth every 7 (seven) days.     No current facility-administered medications on file prior to visit.    No Known Allergies  Social History   Socioeconomic History   Marital status: Married    Spouse name: Adil   Number of children: 0   Years of education: college   Highest education level: Not on file  Occupational History   Occupation: Ticketing    Comment: [employer]  Tobacco Use   Smoking status: Never   Smokeless tobacco: Never  Vaping Use   Vaping status: Never Used  Substance and Sexual Activity   Alcohol use: No   Drug use: No   Sexual activity: Not on file  Other Topics Concern   Not on file  Social History Narrative   From [place].  [place]  in [place]. Lives with her husband. Completed High school education in [place]   Social Drivers of Health   Financial Resource Strain: Low Risk  (05/22/2024)   Overall Financial Resource Strain (CARDIA)    Difficulty of Paying Living Expenses: Not hard at all  Food Insecurity: No Food Insecurity (02/19/2024)   Hunger Vital Sign    Worried About Running Out of Food in the Last Year: Never true    Ran Out of Food in the Last Year: Never true  Transportation Needs: No Transportation Needs (02/19/2024)   PRAPARE - Transportation    Lack of Transportation (  Medical): No    Lack of Transportation (Non-Medical): No  Physical Activity: Insufficiently Active (05/22/2024)   Exercise Vital Sign    Days of Exercise per Week: 3 days    Minutes of Exercise per Session: 30 min  Stress: No Stress Concern Present (05/22/2024)   [employer] of Occupational Health - Occupational Stress Questionnaire    Feeling of Stress: Not at all  Social Connections: Moderately Isolated (05/22/2024)   Social Connection and Isolation Panel    Frequency of Communication with Friends and Family: More than three times a week    Frequency of Social Gatherings with Friends and Family: Twice a week    Attends Religious Services: More than 4 times per year     Active Member of [employer] or Organizations: No    Attends Banker Meetings: Never    Marital Status: Widowed  Intimate Partner Violence: Not At Risk (02/19/2024)   Humiliation, Afraid, Rape, and Kick questionnaire    Fear of Current or Ex-Partner: No    Emotionally Abused: No    Physically Abused: No    Sexually Abused: No    Family History  Problem Relation Age of Onset   Diabetes Brother    Kidney disease Neg Hx    BRCA 1/2 Neg Hx    Breast cancer Neg Hx     Past Surgical History:  Procedure Laterality Date   BREAST BIOPSY Right 04/23/2024   MM RT BREAST BX W LOC DEV 1ST LESION IMAGE BX SPEC STEREO GUIDE 04/23/2024 [facility]   NO PAST SURGERIES      ROS: Review of Systems Negative except as stated above  PHYSICAL EXAM: BP 121/82   Pulse 80   Temp 98.6 F (37 C) (Oral)   Resp 16   Ht 5' 6 (1.676 m)   Wt 158 lb (71.7 kg)   LMP 11/02/2013 Comment: lifelong irregular menses  SpO2 98%   BMI 25.50 kg/m   Physical Exam HENT:     Head: Normocephalic and atraumatic.     Right Ear: Tympanic membrane, ear canal and external ear normal.     Left Ear: Tympanic membrane, ear canal and external ear normal.     Nose: Nose normal.     Mouth/Throat:     Mouth: Mucous membranes are moist.     Pharynx: Oropharynx is clear.   Eyes:     Extraocular Movements: Extraocular movements intact.     Conjunctiva/sclera: Conjunctivae normal.     Pupils: Pupils are equal, round, and reactive to light.   Neck:     Thyroid : No thyroid  mass, thyromegaly or thyroid  tenderness.   Cardiovascular:     Rate and Rhythm: Normal rate and regular rhythm.     Pulses: Normal pulses.     Heart sounds: Normal heart sounds.  Pulmonary:     Effort: Pulmonary effort is normal.     Breath sounds: Normal breath sounds.  Chest:     Comments: Patient declined. Abdominal:     General: Bowel sounds are normal.     Palpations: Abdomen is soft.  Genitourinary:    Comments: Patient  declined.  Musculoskeletal:        General: Normal range of motion.     Right shoulder: Normal.     Left shoulder: Normal.     Right upper arm: Normal.     Left upper arm: Normal.     Right elbow: Normal.     Left elbow: Normal.  Right forearm: Normal.     Left forearm: Normal.     Right wrist: Normal.     Left wrist: Normal.     Right hand: Normal.     Left hand: Normal.     Cervical back: Normal, normal range of motion and neck supple.     Thoracic back: Normal.     Lumbar back: Normal.     Right hip: Normal.     Left hip: Normal.     Right upper leg: Normal.     Left upper leg: Normal.     Right knee: Normal.     Left knee: Normal.     Right lower leg: Normal.     Left lower leg: Normal.     Right ankle: Normal.     Left ankle: Normal.     Right foot: Normal.     Left foot: Normal.   Skin:    General: Skin is warm and dry.     Capillary Refill: Capillary refill takes less than 2 seconds.   Neurological:     General: No focal deficit present.     Mental Status: She is alert and oriented to person, place, and time.   Psychiatric:        Mood and Affect: Mood normal.        Behavior: Behavior normal.     ASSESSMENT AND PLAN: 1. Annual physical exam (Primary) - Counseled on 150 minutes of exercise per week as tolerated, healthy eating (including decreased daily intake of saturated fats, cholesterol, added sugars, sodium), STI prevention, and routine healthcare maintenance.  2. Screening for metabolic disorder - Routine screening.  - CMP14+EGFR  3. Screening for deficiency anemia - Routine screening.  - CBC  4. Thyroid  disorder screen - Routine screening.  - TSH  5. Encounter for screening for HIV - Routine screening.  - HIV antibody (with reflex)  6. Need for hepatitis C screening test - Routine screening.  - Hepatitis C Antibody  7. Cervical cancer screening - Referral to Gynecology for evaluation/management. - Ambulatory referral to  Gynecology  8. Colon cancer screening - Referral to Gastroenterology for colon cancer screening by colonoscopy. - Ambulatory referral to Gastroenterology  9. Primary hypertension - Continue Lisinopril  as prescribed.  - Counseled on blood pressure goal of less than 130/80, low-sodium, DASH diet, medication compliance, and 150 minutes of moderate intensity exercise per week as tolerated. Counseled on medication adherence and adverse effects. - Follow-up with primary provider in 3 months or sooner if needed.  - lisinopril  (ZESTRIL ) 5 MG tablet; Take 1 tablet (5 mg total) by mouth daily.  Dispense: 90 tablet; Refill: 0  10. Type 2 diabetes mellitus with hyperglycemia, without long-term current use of insulin (HCC) - Continue Metformin  XR as prescribed.  - Hemoglobin A1c result pending.  - Discussed the importance of healthy eating habits, low-carbohydrate diet, low-sugar diet, regular aerobic exercise (at least 150 minutes a week as tolerated) and medication compliance to achieve or maintain control of diabetes. Counseled on medication adherence/adverse effects.  - Follow-up with primary provider as scheduled. - metFORMIN  (GLUCOPHAGE -XR) 500 MG 24 hr tablet; Take 1 tablet (500 mg total) by mouth 2 (two) times daily with a meal.  Dispense: 180 tablet; Refill: 0 - Hemoglobin A1c  11. Diabetic eye exam [facility]) - Referral to Ophthalmology for evaluation/management. - Ambulatory referral to Ophthalmology  12. Encounter for diabetic foot exam [facility]) - Patient reports she is up to date.  13. Hyperlipidemia, unspecified hyperlipidemia type -  Continue Atorvastatin  as prescribed. Counseled on medication adherence/adverse effects.  - Routine screening.  - Follow-up with primary provider as scheduled. - atorvastatin  (LIPITOR) 10 MG tablet; Take 1 tablet (10 mg total) by mouth daily.  Dispense: 90 tablet; Refill: 0 - Lipid panel  14. Language barrier - Patient speaking English.   Patient was given  the opportunity to ask questions.  Patient verbalized understanding of the plan and was able to repeat key elements of the plan. Patient was given clear instructions to go to Emergency Department or return to medical center if symptoms don't improve, worsen, or new problems develop.The patient verbalized understanding.   Orders Placed This Encounter  Procedures   HIV antibody (with reflex)   Hepatitis C Antibody   CBC   Lipid panel   CMP14+EGFR   Hemoglobin A1c   TSH   Ambulatory referral to Ophthalmology   Ambulatory referral to Gynecology   Ambulatory referral to Gastroenterology     Requested Prescriptions   Signed Prescriptions Disp Refills   atorvastatin  (LIPITOR) 10 MG tablet 90 tablet 0    Sig: Take 1 tablet (10 mg total) by mouth daily.   metFORMIN  (GLUCOPHAGE -XR) 500 MG 24 hr tablet 180 tablet 0    Sig: Take 1 tablet (500 mg total) by mouth 2 (two) times daily with a meal.   lisinopril  (ZESTRIL ) 5 MG tablet 90 tablet 0    Sig: Take 1 tablet (5 mg total) by mouth daily.    Return in about 1 year (around 05/22/2025) for Physical per patient preference.  Greig JINNY Drones, NP

## 2024-05-22 NOTE — Progress Notes (Signed)
 No concerns.

## 2024-05-23 LAB — CBC
Hematocrit: 40.5 % (ref 34.0–46.6)
Hemoglobin: 13 g/dL (ref 11.1–15.9)
MCH: 27.8 pg (ref 26.6–33.0)
MCHC: 32.1 g/dL (ref 31.5–35.7)
MCV: 87 fL (ref 79–97)
Platelets: 281 10*3/uL (ref 150–450)
RBC: 4.67 x10E6/uL (ref 3.77–5.28)
RDW: 13.3 % (ref 11.7–15.4)
WBC: 5.9 10*3/uL (ref 3.4–10.8)

## 2024-05-23 LAB — LIPID PANEL
Chol/HDL Ratio: 4.4 ratio (ref 0.0–4.4)
Cholesterol, Total: 176 mg/dL (ref 100–199)
HDL: 40 mg/dL (ref >39)
LDL Chol Calc (NIH): 107 mg/dL — ABNORMAL HIGH (ref 0–99)
Triglycerides: 165 mg/dL — ABNORMAL HIGH (ref 0–149)
VLDL Cholesterol Cal: 29 mg/dL (ref 5–40)

## 2024-05-23 LAB — HEPATITIS C ANTIBODY: Hep C Virus Ab: NONREACTIVE

## 2024-05-23 LAB — CMP14+EGFR
ALT: 23 IU/L (ref 0–32)
AST: 21 IU/L (ref 0–40)
Albumin: 4.5 g/dL (ref 3.8–4.9)
Alkaline Phosphatase: 150 IU/L — ABNORMAL HIGH (ref 44–121)
BUN/Creatinine Ratio: 19 (ref 9–23)
BUN: 13 mg/dL (ref 6–24)
Bilirubin Total: 0.3 mg/dL (ref 0.0–1.2)
CO2: 24 mmol/L (ref 20–29)
Calcium: 10.6 mg/dL — ABNORMAL HIGH (ref 8.7–10.2)
Chloride: 99 mmol/L (ref 96–106)
Creatinine, Ser: 0.67 mg/dL (ref 0.57–1.00)
Globulin, Total: 3.3 g/dL (ref 1.5–4.5)
Glucose: 99 mg/dL (ref 70–99)
Potassium: 4.7 mmol/L (ref 3.5–5.2)
Sodium: 138 mmol/L (ref 134–144)
Total Protein: 7.8 g/dL (ref 6.0–8.5)
eGFR: 103 mL/min/{1.73_m2} (ref >59)

## 2024-05-23 LAB — HEMOGLOBIN A1C
Est. average glucose Bld gHb Est-mCnc: 134 mg/dL
Hgb A1c MFr Bld: 6.3 % — ABNORMAL HIGH (ref 4.8–5.6)

## 2024-05-23 LAB — HIV ANTIBODY (ROUTINE TESTING W REFLEX): HIV Screen 4th Generation wRfx: NONREACTIVE

## 2024-05-23 LAB — TSH: TSH: 0.697 u[IU]/mL (ref 0.450–4.500)

## 2024-05-25 ENCOUNTER — Ambulatory Visit: Payer: Self-pay | Admitting: Family

## 2024-05-27 ENCOUNTER — Other Ambulatory Visit: Payer: Self-pay

## 2024-05-27 ENCOUNTER — Ambulatory Visit: Payer: Self-pay | Admitting: Nurse Practitioner

## 2024-05-27 ENCOUNTER — Ambulatory Visit (INDEPENDENT_AMBULATORY_CARE_PROVIDER_SITE_OTHER): Admitting: Nurse Practitioner

## 2024-05-27 ENCOUNTER — Encounter: Payer: Self-pay | Admitting: Nurse Practitioner

## 2024-05-27 ENCOUNTER — Other Ambulatory Visit (INDEPENDENT_AMBULATORY_CARE_PROVIDER_SITE_OTHER)

## 2024-05-27 VITALS — BP 122/70 | HR 83 | Ht 66.0 in | Wt 157.0 lb

## 2024-05-27 DIAGNOSIS — K760 Fatty (change of) liver, not elsewhere classified: Secondary | ICD-10-CM

## 2024-05-27 DIAGNOSIS — R748 Abnormal levels of other serum enzymes: Secondary | ICD-10-CM

## 2024-05-27 DIAGNOSIS — Z1211 Encounter for screening for malignant neoplasm of colon: Secondary | ICD-10-CM

## 2024-05-27 LAB — HEPATIC FUNCTION PANEL
ALT: 19 U/L (ref 0–35)
AST: 20 U/L (ref 0–37)
Albumin: 4.6 g/dL (ref 3.5–5.2)
Alkaline Phosphatase: 113 U/L (ref 39–117)
Bilirubin, Direct: 0.1 mg/dL (ref 0.0–0.3)
Total Bilirubin: 0.4 mg/dL (ref 0.2–1.2)
Total Protein: 8.4 g/dL — ABNORMAL HIGH (ref 6.0–8.3)

## 2024-05-27 LAB — GAMMA GT: GGT: 28 U/L (ref 7–51)

## 2024-05-27 MED ORDER — NA SULFATE-K SULFATE-MG SULF 17.5-3.13-1.6 GM/177ML PO SOLN
1.0000 | Freq: Once | ORAL | 0 refills | Status: AC
  Filled 2024-05-27: qty 354, 2d supply, fill #0

## 2024-05-27 NOTE — Patient Instructions (Addendum)
 Your provider has requested that you go to the basement level for lab work before leaving today. Press B on the elevator. The lab is located at the first door on the left as you exit the elevator.   Please stop your multivitamin with calcium  for now. Follow up with your primary care provider Amy Lorren in 2 weeks to have a repeat calcium  level and parathyroid  hormone (PTH) level done.   Reduce carbohydrate in your diet ie: reduce bread, pasta, potato, rice and sweets, exercise as tolerated and lose 10 lbs to reduce the risk of developing fatty liver disease.  Benefiber one tablespoon once daily to avoid constipation and straining to pass a stool.  You have been scheduled for a colonoscopy. Please follow written instructions given to you at your visit today.   If you use inhalers (even only as needed), please bring them with you on the day of your procedure.  DO NOT TAKE 7 DAYS PRIOR TO TEST- Trulicity (dulaglutide) Ozempic, Wegovy (semaglutide) Mounjaro (tirzepatide) Bydureon Bcise (exanatide extended release)  DO NOT TAKE 1 DAY PRIOR TO YOUR TEST Rybelsus (semaglutide) Adlyxin (lixisenatide) Victoza (liraglutide) Byetta (exanatide) ___________________________________________________________________________  If your blood pressure at your visit was 140/90 or greater, please contact your primary care physician to follow up on this.  _______________________________________________________  If you are age 84 or older, your body mass index should be between 23-30. Your Body mass index is 25.34 kg/m. If this is out of the aforementioned range listed, please consider follow up with your Primary Care Provider.  If you are age 2 or younger, your body mass index should be between 19-25. Your Body mass index is 25.34 kg/m. If this is out of the aformentioned range listed, please consider follow up with your Primary Care Provider.    ________________________________________________________  The [facility] providers would like to encourage you to use MYCHART to communicate with providers for non-urgent requests or questions.  Due to long hold times on the telephone, sending your provider a message by [facility] may be a faster and more efficient way to get a response.  Please allow 48 business hours for a response.  Please remember that this is for non-urgent requests.  _______________________________________________________

## 2024-05-27 NOTE — Progress Notes (Signed)
 05/27/2024 Crystal Doyle [id number] 01-08-1971   CHIEF COMPLAINT: Fatty liver   HISTORY OF PRESENT ILLNESS: Crystal Doyle is a 53 year old female with a past medical history of hypercholesterolemia and DM type II. She presents to our office today as referred by Greig Drones NP for further evaluation regarding hepatic steatosis and for colon cancer screening. She denies having any prior history of liver disease. She underwent a RUQ sono 03/06/2024 which showed possible hepatic steatosis with a normal gallbladder without biliary ductal dilatation. Alk phos levels chronically elevated since at least 11/2020 with normal T. Bili, AST/ALT levels. Calcium  levels elevated since at least 11/2020. She takes a multivitamin with calcium  300mg  once daily. No N/V.  She denies having any heartburn or dysphagia. No upper or lower abdominal pain. She typically passes a normal brown formed stool once daily, sometimes strains to pass a BM. No rectal bleeding or  black stools. Never had a screening colonoscopy.  No known family history of esophageal, gastric or colon cancer.  She is originally from [place], primary language Arabic and speaks English fluently.      Latest Ref Rng & Units 05/22/2024    9:44 AM 02/19/2024    5:20 PM 12/14/2020    2:34 PM  CBC  WBC 3.4 - 10.8 x10E3/uL 5.9  4.3  5.2   Hemoglobin 11.1 - 15.9 g/dL 86.9  85.5  86.3   Hematocrit 34.0 - 46.6 % 40.5  43.8  42.7   Platelets 150 - 450 x10E3/uL 281  303  255           Latest Ref Rng & Units 05/22/2024    9:44 AM 02/19/2024    5:20 PM 12/14/2020    2:34 PM  CMP  Glucose 70 - 99 mg/dL 99  99  851   BUN 6 - 24 mg/dL 13  11  14    Creatinine 0.57 - 1.00 mg/dL 9.32  9.43  9.43   Sodium 134 - 144 mmol/L 138  137  142   Potassium 3.5 - 5.2 mmol/L 4.7  4.4  4.4   Chloride 96 - 106 mmol/L 99  98  101   CO2 20 - 29 mmol/L 24   23   Calcium  8.7 - 10.2 mg/dL 89.3  89.2  89.0   Total Protein 6.0 - 8.5 g/dL 7.8  8.2  7.8   Total Bilirubin  0.0 - 1.2 mg/dL 0.3  <9.7  <9.7   Alkaline Phos 44 - 121 IU/L 150  135  160   AST 0 - 40 IU/L 21  29  20    ALT 0 - 32 IU/L 23   21    TSH 0.697  RUQ sonogram 03/06/2024: FINDINGS: Gallbladder:   No gallstones or wall thickening visualized. No sonographic Murphy sign noted by sonographer.   Common bile duct:   Diameter: 3.9 mm.   Liver:   No focal lesion. Increased echotexture. Portal vein is patent on color Doppler imaging with normal direction of blood flow towards the liver.   Other: None.   IMPRESSION: 1. No acute abnormality identified. 2. Increased echotexture of the liver. This is a nonspecific finding but can be seen in fatty infiltration of liver.     Past Medical History:  Diagnosis Date   Known health problems: none    Past Surgical History:  Procedure Laterality Date   BREAST BIOPSY Right 04/23/2024   MM RT BREAST BX W LOC DEV 1ST LESION IMAGE BX  SPEC STEREO GUIDE 04/23/2024 [facility] MAMMOGR[facility]HY   NO PAST SURGERIES     Social History: She is widowed.  She is originally from [place], [place].  Non-smoker.  No alcohol use.  No drug use.  Family History: Brother with diabetes.  No known family history of esophageal, gastric, colon, liver or pancreatic cancer.  No Known Allergies    Outpatient Encounter Medications as of 05/27/2024  Medication Sig   Accu-Chek Softclix Lancets lancets USE 1 each daily & as instructed.   atorvastatin  (LIPITOR) 10 MG tablet Take 1 tablet (10 mg total) by mouth daily.   B Complex Vitamins (VITAMIN-B COMPLEX PO) Take by mouth as needed.   Blood Pressure Monitoring (BLOOD PRESSURE CUFF) MISC 1 Units by Does not apply route daily.   Cholecalciferol (D3-1000 PO) Take 1 tablet by mouth daily.   diclofenac  sodium (VOLTAREN ) 1 % GEL Apply 2 g topically 4 (four) times daily.   glucose blood (ACCU-CHEK AVIVA PLUS) test strip USE 1 as needed & AS INSTRUCTED.   ibuprofen  (ADVIL ) 600 MG tablet Take 1 tablet (600 mg total)  by mouth every 8 (eight) hours as needed. For pain; take after eating   lisinopril  (ZESTRIL ) 5 MG tablet Take 1 tablet (5 mg total) by mouth daily.   metFORMIN  (GLUCOPHAGE -XR) 500 MG 24 hr tablet Take 1 tablet (500 mg total) by mouth 2 (two) times daily with a meal.   Multiple Vitamin (MULTIVITAMIN) tablet Take 1 tablet by mouth daily.   Vitamin D , Ergocalciferol , (DRISDOL) 1.25 MG (50000 UNIT) CAPS capsule Take 50,000 Units by mouth every 7 (seven) days.   No facility-administered encounter medications on file as of 05/27/2024.   REVIEW OF SYSTEMS:  Gen: Denies fever, sweats or chills. No weight loss.  CV: Denies chest pain, palpitations or edema. Resp: Denies cough, shortness of breath of hemoptysis.  GI: See HPI.  GU: Denies urinary burning, blood in urine, increased urinary frequency or incontinence. MS: Denies joint pain, muscles aches or weakness. Derm: Denies rash, itchiness, skin lesions or unhealing ulcers. Psych: Denies depression, anxiety, memory loss or confusion. Heme: Denies bruising, easy bleeding. Neuro:  Denies headaches, dizziness or paresthesias. Endo:  + DM type II.  PHYSICAL EXAM: LMP 11/02/2013 Comment: lifelong irregular menses BP 122/70 (BP Location: Right Arm, Patient Position: Sitting, Cuff Size: Normal)   Pulse 83   Ht 5' 6 (1.676 m)   Wt 157 lb (71.2 kg)   LMP 11/02/2013 Comment: lifelong irregular menses  BMI 25.34 kg/m   General: 53 year old female in no acute distress. Head: Normocephalic and atraumatic. Eyes:  Sclerae non-icteric, conjunctive pink. Ears: Normal auditory acuity. Mouth: Dentition intact. No ulcers or lesions.  Neck: Supple, no lymphadenopathy or thyromegaly.  Lungs: Clear bilaterally to auscultation without wheezes, crackles or rhonchi. Heart: Regular rate and rhythm. No murmur, rub or gallop appreciated.  Abdomen: Soft, nontender, nondistended. No masses. No hepatosplenomegaly. Normoactive bowel sounds x 4 quadrants.  Rectal:  Deferred. Musculoskeletal: Symmetrical with no gross deformities. Skin: Warm and dry. No rash or lesions on visible extremities. Extremities: No edema. Neurological: Alert oriented x 4, no focal deficits.  Psychological: Alert and cooperative. Normal mood and affect.  ASSESSMENT AND PLAN:  53 year old female with hepatic steatosis. Elevated alk phos level with normal T. Bili and AST/ALT levels. -Hepatic panel, Alk phos isoenzymes and GGT level  -Reduce carbohydrates in diet ie: Reduce bread, pasta, rice, potatoes and sweets, exercise as tolerated and lose weight to reduce  the risk of developing fatty liver disease  Colon cancer screening.  Never had a screening colonoscopy.  No known family history of colon polyps or colorectal cancer. -Colonoscopy benefits and risks discussed including risk with sedation, risk of bleeding, perforation and infection   Hypercalcemia, chronic. Calcium  level 10.9 -> 10.6. She takes a multivitamin daily with 300 mg of calcium . -Stop multivitamin with calcium  and follow-up with PCP in 2 weeks for repeat calcium  level and to check a PTH level to rule out hyperparathyroidism in setting of elevated calcium  and alk phos levels. -Alk phos isoenzymes to rule out bone source for elevated alk phos level  -Defer further evaluation to PCP  Further GI recommendations to be determined after the above evaluation completed    CC:  Lorren Greig PARAS, NP

## 2024-05-29 LAB — ALKALINE PHOSPHATASE ISOENZYMES
Alkaline phosphatase (APISO): 120 U/L (ref 37–153)
Bone Isoenzymes: 24 % — ABNORMAL LOW (ref 28–66)
Intestinal Isoenzymes: 8 % (ref 1–24)
Liver Isoenzymes: 68 % (ref 25–69)
Macrohepatic isoenzymes: 0 % (ref <=0)
Placental isoenzymes: 0 % (ref <=0)

## 2024-05-31 NOTE — Progress Notes (Signed)
 Agree with assessment and plan as outlined.

## 2024-06-02 ENCOUNTER — Ambulatory Visit: Admitting: Gastroenterology

## 2024-06-02 ENCOUNTER — Encounter: Payer: Self-pay | Admitting: Gastroenterology

## 2024-06-02 VITALS — BP 123/82 | HR 73 | Temp 98.2°F | Resp 10 | Ht 66.0 in | Wt 157.0 lb

## 2024-06-02 DIAGNOSIS — K648 Other hemorrhoids: Secondary | ICD-10-CM | POA: Diagnosis not present

## 2024-06-02 DIAGNOSIS — D125 Benign neoplasm of sigmoid colon: Secondary | ICD-10-CM | POA: Diagnosis not present

## 2024-06-02 DIAGNOSIS — Z1211 Encounter for screening for malignant neoplasm of colon: Secondary | ICD-10-CM

## 2024-06-02 DIAGNOSIS — E119 Type 2 diabetes mellitus without complications: Secondary | ICD-10-CM | POA: Diagnosis not present

## 2024-06-02 DIAGNOSIS — E785 Hyperlipidemia, unspecified: Secondary | ICD-10-CM | POA: Diagnosis not present

## 2024-06-02 DIAGNOSIS — T184XXA Foreign body in colon, initial encounter: Secondary | ICD-10-CM | POA: Diagnosis not present

## 2024-06-02 DIAGNOSIS — K635 Polyp of colon: Secondary | ICD-10-CM | POA: Diagnosis not present

## 2024-06-02 DIAGNOSIS — I10 Essential (primary) hypertension: Secondary | ICD-10-CM | POA: Diagnosis not present

## 2024-06-02 MED ORDER — SODIUM CHLORIDE 0.9 % IV SOLN: 500.0000 mL | INTRAVENOUS | Status: DC

## 2024-06-02 NOTE — Progress Notes (Signed)
 [facility] History and Physical   Primary Care Physician:  Lorren Greig PARAS, NP   Reason for Procedure:   Colon cancer screening  Plan:    colonoscopy     HPI: Crystal Doyle is a 53 y.o. female  here for colonoscopy screening - first time exam.   . Patient denies any bowel symptoms at this time. No family history of colon cancer known. Otherwise feels well without any cardiopulmonary symptoms.   I have discussed risks / benefits of anesthesia and endoscopic procedure with Reneta Mohamed Zaki and they wish to proceed with the exams as outlined today.    Past Medical History:  Diagnosis Date   Diabetes (HCC)    Hyperlipidemia    Hypertension     Past Surgical History:  Procedure Laterality Date   BREAST BIOPSY Right 04/23/2024   MM RT BREAST BX W LOC DEV 1ST LESION IMAGE BX SPEC STEREO GUIDE 04/23/2024 [facility]    Prior to Admission medications   Medication Sig Start Date End Date Taking? Authorizing Provider  Accu-Chek Softclix Lancets lancets USE 1 each daily & as instructed. 02/19/24  Yes Mayers, Cari S, PA-C  atorvastatin  (LIPITOR) 10 MG tablet Take 1 tablet (10 mg total) by mouth daily. 05/22/24  Yes Lorren, Amy J, NP  Blood Pressure Monitoring (BLOOD PRESSURE CUFF) MISC 1 Units by Does not apply route daily. 02/19/24  Yes Mayers, Cari S, PA-C  glucose blood (ACCU-CHEK AVIVA PLUS) test strip USE 1 as needed & AS INSTRUCTED. 02/19/24  Yes Mayers, Cari S, PA-C  lisinopril  (ZESTRIL ) 5 MG tablet Take 1 tablet (5 mg total) by mouth daily. 05/22/24  Yes Lorren, Amy J, NP  metFORMIN  (GLUCOPHAGE -XR) 500 MG 24 hr tablet Take 1 tablet (500 mg total) by mouth 2 (two) times daily with a meal. 05/22/24  Yes Lorren, Amy J, NP  B Complex Vitamins (VITAMIN-B COMPLEX PO) Take by mouth as needed.    [provider]  Cholecalciferol (D3-1000 PO) Take 1 tablet by mouth daily.    [provider]  diclofenac  sodium (VOLTAREN ) 1 % GEL Apply 2 g  topically 4 (four) times daily. Patient not taking: Reported on 05/27/2024    [provider]  ibuprofen  (ADVIL ) 600 MG tablet Take 1 tablet (600 mg total) by mouth every 8 (eight) hours as needed. For pain; take after eating 08/05/20   Fulp, Cammie, MD  Multiple Vitamin (MULTIVITAMIN) tablet Take 1 tablet by mouth daily.    [provider]  Vitamin D , Ergocalciferol , (DRISDOL) 1.25 MG (50000 UNIT) CAPS capsule Take 50,000 Units by mouth every 7 (seven) days.    [provider]    Current Outpatient Medications  Medication Sig Dispense Refill   Accu-Chek Softclix Lancets lancets USE 1 each daily & as instructed. 100 each 12   atorvastatin  (LIPITOR) 10 MG tablet Take 1 tablet (10 mg total) by mouth daily. 90 tablet 0   Blood Pressure Monitoring (BLOOD PRESSURE CUFF) MISC 1 Units by Does not apply route daily. 1 each 0   glucose blood (ACCU-CHEK AVIVA PLUS) test strip USE 1 as needed & AS INSTRUCTED. 100 each 12   lisinopril  (ZESTRIL ) 5 MG tablet Take 1 tablet (5 mg total) by mouth daily. 90 tablet 0   metFORMIN  (GLUCOPHAGE -XR) 500 MG 24 hr tablet Take 1 tablet (500 mg total) by mouth 2 (two) times daily with a meal. 180 tablet 0   B Complex Vitamins (VITAMIN-B COMPLEX PO) Take by mouth as needed.  Cholecalciferol (D3-1000 PO) Take 1 tablet by mouth daily.     diclofenac  sodium (VOLTAREN ) 1 % GEL Apply 2 g topically 4 (four) times daily. (Patient not taking: Reported on 05/27/2024)     ibuprofen  (ADVIL ) 600 MG tablet Take 1 tablet (600 mg total) by mouth every 8 (eight) hours as needed. For pain; take after eating 30 tablet 0   Multiple Vitamin (MULTIVITAMIN) tablet Take 1 tablet by mouth daily.     Vitamin D , Ergocalciferol , (DRISDOL) 1.25 MG (50000 UNIT) CAPS capsule Take 50,000 Units by mouth every 7 (seven) days.     Current Facility-Administered Medications  Medication Dose Route Frequency Provider Last Rate Last Admin   0.9 %  sodium chloride  infusion  500 mL  Intravenous Continuous Aryn Kops, Elspeth SQUIBB, MD        Allergies as of 06/02/2024   (No Known Allergies)    Family History  Problem Relation Age of Onset   Diabetes Brother    Kidney disease Neg Hx    BRCA 1/2 Neg Hx    Breast cancer Neg Hx    Colon cancer Neg Hx    Esophageal cancer Neg Hx    Rectal cancer Neg Hx    Stomach cancer Neg Hx     Social History   Socioeconomic History   Marital status: Widowed    Spouse name: Adil   Number of children: 0   Years of education: college   Highest education level: Not on file  Occupational History   Occupation: Ticketing    Comment: [employer]  Tobacco Use   Smoking status: Never   Smokeless tobacco: Never  Vaping Use   Vaping status: Never Used  Substance and Sexual Activity   Alcohol use: No   Drug use: No   Sexual activity: Not on file  Other Topics Concern   Not on file  Social History Narrative   From [place].  Came to the [place]  in 2004. Lives with her husband. Completed High school education in [place]   Social Drivers of Health   Financial Resource Strain: Low Risk  (05/22/2024)   Overall Financial Resource Strain (CARDIA)    Difficulty of Paying Living Expenses: Not hard at all  Food Insecurity: No Food Insecurity (02/19/2024)   Hunger Vital Sign    Worried About Running Out of Food in the Last Year: Never true    Ran Out of Food in the Last Year: Never true  Transportation Needs: No Transportation Needs (02/19/2024)   PRAPARE - Administrator, Civil Service (Medical): No    Lack of Transportation (Non-Medical): No  Physical Activity: Insufficiently Active (05/22/2024)   Exercise Vital Sign    Days of Exercise per Week: 3 days    Minutes of Exercise per Session: 30 min  Stress: No Stress Concern Present (05/22/2024)   [employer] of Occupational Health - Occupational Stress Questionnaire    Feeling of Stress: Not at all  Social Connections: Moderately Isolated (05/22/2024)   Social Connection and  Isolation Panel    Frequency of Communication with Friends and Family: More than three times a week    Frequency of Social Gatherings with Friends and Family: Twice a week    Attends Religious Services: More than 4 times per year    Active Member of [employer] or Organizations: No    Attends Banker Meetings: Never    Marital Status: Widowed  Intimate Partner Violence: Not At Risk (02/19/2024)   Humiliation, Afraid, Rape, and  Kick questionnaire    Fear of Current or Ex-Partner: No    Emotionally Abused: No    Physically Abused: No    Sexually Abused: No    Review of Systems: All other review of systems negative except as mentioned in the HPI.  Physical Exam: Vital signs BP 135/79   Pulse 82   Temp 98.2 F (36.8 C) (Temporal)   Ht 5' 6 (1.676 m)   Wt 157 lb (71.2 kg)   LMP 11/02/2013 Comment: lifelong irregular menses  SpO2 98%   BMI 25.34 kg/m   General:   Alert,  Well-developed, pleasant and cooperative in NAD Lungs:  Clear throughout to auscultation.   Heart:  Regular rate and rhythm Abdomen:  Soft, nontender and nondistended.   Neuro/Psych:  Alert and cooperative. Normal mood and affect. A and O x 3  Marcey Naval, MD [facility]

## 2024-06-02 NOTE — Progress Notes (Signed)
 Report to PACU, RN, vss, BBS= Clear.

## 2024-06-02 NOTE — Progress Notes (Signed)
 Pt's states no medical or surgical changes since previsit or office visit.

## 2024-06-02 NOTE — Op Note (Signed)
 [facility] Patient Name: Crystal Doyle Procedure Date: 06/02/2024 10:32 AM MRN: [id number] Endoscopist: Elspeth P. Leigh , MD, [id number] Age: 53 Referring MD:  Date of Birth: 07/12/1971 Gender: Female Account #: [id number] Procedure:                Colonoscopy Indications:              Screening for colorectal malignant neoplasm, This                            is the patient's first colonoscopy Medicines:                Monitored Anesthesia Care Procedure:                Pre-Anesthesia Assessment:                           - Prior to the procedure, a History and Physical                            was performed, and patient medications and                            allergies were reviewed. The patient's tolerance of                            previous anesthesia was also reviewed. The risks                            and benefits of the procedure and the sedation                            options and risks were discussed with the patient.                            All questions were answered, and informed consent                            was obtained. Prior Anticoagulants: The patient has                            taken no anticoagulant or antiplatelet agents. ASA                            Grade Assessment: II - A patient with mild systemic                            disease. After reviewing the risks and benefits,                            the patient was deemed in satisfactory condition to                            undergo the procedure.  After obtaining informed consent, the colonoscope                            was passed under direct vision. Throughout the                            procedure, the patient's blood pressure, pulse, and                            oxygen saturations were monitored continuously. The                            Olympus Scope SN [id number] was introduced through the                            anus and  advanced to the the cecum, identified by                            appendiceal orifice and ileocecal valve. The                            colonoscopy was performed without difficulty. The                            patient tolerated the procedure well. The quality                            of the bowel preparation was adequate. The                            ileocecal valve, appendiceal orifice, and rectum                            were photographed. Scope In: 10:37:26 AM Scope Out: 11:04:58 AM Scope Withdrawal Time: 0 hours 24 minutes 30 seconds  Total Procedure Duration: 0 hours 27 minutes 32 seconds  Findings:                 The perianal and digital rectal examinations were                            normal.                           A large amount of liquid stool was found in the                            entire colon, making visualization difficult.                            Lavage of the colon was performed using copious                            amounts of sterile water, resulting in clearance  with adequate visualization. This process was time                            consuming and prolonged the exam but was able to be                            done to ensure adequate views. Retained pills noted                            in the right colon.                           Two sessile polyps were found in the distal sigmoid                            colon - roughly 3mm and 8-29mm or so. These polyps                            were removed with a cold snare. Resection and                            retrieval were complete.                           Internal hemorrhoids were found during retroflexion.                           The exam was otherwise without abnormality. Complications:            No immediate complications. Estimated blood loss:                            Minimal. Estimated Blood Loss:     Estimated blood loss was  minimal. Impression:               - Stool in the entire examined colon leading to                            extensive lavage.                           - Two polyps in the distal sigmoid colon, removed                            with a cold snare. Resected and retrieved.                           - Internal hemorrhoids.                           - The examination was otherwise normal. Recommendation:           - Patient has a contact number available for                            emergencies.  The signs and symptoms of potential                            delayed complications were discussed with the                            patient. Return to normal activities tomorrow.                            Written discharge instructions were provided to the                            patient.                           - Resume previous diet.                           - Continue present medications.                           - Await pathology results. Elspeth P. Darek Eifler, MD 06/02/2024 11:10:24 AM This report has been signed electronically.

## 2024-06-02 NOTE — Patient Instructions (Signed)
    Handouts on polyps & hemorrhoids given to you today.   Await pathology results on polyps removed   Continue previous diet & medications  May return to normal activities tomorrow    YOU HAD AN ENDOSCOPIC PROCEDURE TODAY AT THE [facility]:   Refer to the procedure report that was given to you for any specific questions about what was found during the examination.  If the procedure report does not answer your questions, please call your gastroenterologist to clarify.  If you requested that your care partner not be given the details of your procedure findings, then the procedure report has been included in a sealed envelope for you to review at your convenience later.  YOU SHOULD EXPECT: Some feelings of bloating in the abdomen. Passage of more gas than usual.  Walking can help get rid of the air that was put into your GI tract during the procedure and reduce the bloating. If you had a lower endoscopy (such as a colonoscopy or flexible sigmoidoscopy) you may notice spotting of blood in your stool or on the toilet paper. If you underwent a bowel prep for your procedure, you may not have a normal bowel movement for a few days.  Please Note:  You might notice some irritation and congestion in your nose or some drainage.  This is from the oxygen used during your procedure.  There is no need for concern and it should clear up in a day or so.  SYMPTOMS TO REPORT IMMEDIATELY:  Following lower endoscopy (colonoscopy or flexible sigmoidoscopy):  Excessive amounts of blood in the stool  Significant tenderness or worsening of abdominal pains  Swelling of the abdomen that is new, acute  Fever of 100F or higher   For urgent or emergent issues, a gastroenterologist can be reached at any hour by calling [phone]. Do not use MyChart messaging for urgent concerns.    DIET:  We do recommend a small meal at first, but then you may proceed to your regular diet.  Drink plenty of fluids  but you should avoid alcoholic beverages for 24 hours.  ACTIVITY:  You should plan to take it easy for the rest of today and you should NOT DRIVE or use heavy machinery until tomorrow (because of the sedation medicines used during the test).    FOLLOW UP: Our staff will call the number listed on your records the next business day following your procedure.  We will call around 7:15- 8:00 am to check on you and address any questions or concerns that you may have regarding the information given to you following your procedure. If we do not reach you, we will leave a message.     If any biopsies were taken you will be contacted by phone or by letter within the next 1-3 weeks.  Please call us  at [phone] if you have not heard about the biopsies in 3 weeks.    SIGNATURES/CONFIDENTIALITY: You and/or your care partner have signed paperwork which will be entered into your electronic medical record.  These signatures attest to the fact that that the information above on your After Visit Summary has been reviewed and is understood.  Full responsibility of the confidentiality of this discharge information lies with you and/or your care-partner.

## 2024-06-02 NOTE — Progress Notes (Signed)
 Called to room to assist during endoscopic procedure.  Patient ID and intended procedure confirmed with present staff. Received instructions for my participation in the procedure from the performing physician.

## 2024-06-03 ENCOUNTER — Telehealth: Payer: Self-pay

## 2024-06-03 NOTE — Telephone Encounter (Signed)
 Called number twice [phone]  .Unable to LVM- rings then busy signal. Voicemail not set up.

## 2024-06-04 LAB — SURGICAL PATHOLOGY

## 2024-06-05 ENCOUNTER — Ambulatory Visit: Payer: Self-pay | Admitting: Gastroenterology

## 2024-06-11 ENCOUNTER — Other Ambulatory Visit: Payer: Self-pay

## 2024-06-15 ENCOUNTER — Other Ambulatory Visit: Payer: Self-pay

## 2024-06-17 ENCOUNTER — Other Ambulatory Visit: Payer: Self-pay

## 2024-07-01 NOTE — Progress Notes (Signed)
 The patient attended a screening event on 02/19/2024 where her screening results were BP of 129/79, blood glucose was 132 non fasting, and A1C of 6.7. At the event the patient noted she does not smoke and does not live with anyone that smokes. Patient did not notate any insurance coverage or a PCP. At the time of the event patient did not present with any SDOH needs but was given educational materials and referred to mobile bus for diabetes melilitus. [facility] was unable to reach patient for 60 day f/u and left a message for patient. Patient did attempt to reach back out to [facility], but again [facility] was unsuccessful in reaching the patient once returning the phone call.  Per chart review the patient was seen on the mobile unit on 3/26 with Dr. Kirk Sage for her A1C levels presented at the event. Patient was instructed to f/u with her PCP. Chart review indicates the patient did attend her appt with NP Amy JINNY Drones on 04/15/2024 to establish care,had her annual with her on 05/22/2024, and also has another upcoming appt with her GI on 05/27/2025. Chart review also indicates the patient has no SDOH needs and has Medicaid Coverage. No additional Health equity team support indicated at this time.

## 2024-07-14 ENCOUNTER — Other Ambulatory Visit: Payer: Self-pay

## 2024-07-30 ENCOUNTER — Other Ambulatory Visit: Payer: Self-pay

## 2024-08-10 ENCOUNTER — Other Ambulatory Visit: Payer: Self-pay

## 2024-08-11 ENCOUNTER — Other Ambulatory Visit: Payer: Self-pay

## 2024-08-26 ENCOUNTER — Other Ambulatory Visit: Payer: Self-pay

## 2024-09-08 ENCOUNTER — Other Ambulatory Visit: Payer: Self-pay | Admitting: Physician Assistant

## 2024-09-08 ENCOUNTER — Other Ambulatory Visit: Payer: Self-pay | Admitting: Family

## 2024-09-08 DIAGNOSIS — Z09 Encounter for follow-up examination after completed treatment for conditions other than malignant neoplasm: Secondary | ICD-10-CM

## 2024-09-09 ENCOUNTER — Other Ambulatory Visit: Payer: Self-pay

## 2024-09-09 ENCOUNTER — Other Ambulatory Visit: Payer: Self-pay | Admitting: Family

## 2024-09-09 DIAGNOSIS — E1165 Type 2 diabetes mellitus with hyperglycemia: Secondary | ICD-10-CM

## 2024-09-09 LAB — AMB RESULTS CONSOLE CBG: Glucose: 113

## 2024-09-09 MED ORDER — METFORMIN HCL ER 500 MG PO TB24
500.0000 mg | ORAL_TABLET | Freq: Two times a day (BID) | ORAL | 0 refills | Status: DC
  Filled 2024-09-09: qty 180, 90d supply, fill #0

## 2024-09-09 NOTE — Progress Notes (Signed)
Pt has PCP. No SDOH needs at this time

## 2024-09-11 ENCOUNTER — Other Ambulatory Visit: Payer: Self-pay

## 2024-09-11 ENCOUNTER — Other Ambulatory Visit: Payer: Self-pay | Admitting: Family

## 2024-09-11 DIAGNOSIS — E785 Hyperlipidemia, unspecified: Secondary | ICD-10-CM

## 2024-09-11 MED ORDER — ATORVASTATIN CALCIUM 10 MG PO TABS
10.0000 mg | ORAL_TABLET | Freq: Every day | ORAL | 0 refills | Status: DC
  Filled 2024-09-11: qty 90, 90d supply, fill #0

## 2024-09-11 NOTE — Telephone Encounter (Signed)
 Complete

## 2024-09-14 ENCOUNTER — Other Ambulatory Visit: Payer: Self-pay | Admitting: Family

## 2024-09-14 ENCOUNTER — Other Ambulatory Visit: Payer: Self-pay

## 2024-09-14 DIAGNOSIS — I10 Essential (primary) hypertension: Secondary | ICD-10-CM

## 2024-09-14 MED ORDER — LISINOPRIL 5 MG PO TABS
5.0000 mg | ORAL_TABLET | Freq: Every day | ORAL | 0 refills | Status: AC
Start: 2024-09-14 — End: ?
  Filled 2024-09-14 – 2024-11-09 (×3): qty 90, 90d supply, fill #0

## 2024-10-13 ENCOUNTER — Other Ambulatory Visit: Payer: Self-pay

## 2024-10-16 NOTE — Progress Notes (Signed)
 The patient attended a screening event on 09/09/2024, where her blood pressure was measured at 111/78 mmHg, and her non-fasting 113 mg/dl. During the event, the patient reported that she does not smoke. She did not indicate her insurance, is established with a primary care provider Anniece?), and declined SDOH screener.  A chart review indicates Amy JINNY Chute, NP as her PCP. The patient's most recent office visit was on 05/22/2024 with her PCP. She has an upcoming appt on 05/27/2025 at 8:00 AM. She has Medicaid for her insurance and no SDOH needs indicated at this time.   At this time, no additional support from the Health Equity Team is indicated.

## 2024-10-19 ENCOUNTER — Other Ambulatory Visit: Payer: Self-pay

## 2024-10-30 ENCOUNTER — Inpatient Hospital Stay: Admission: RE | Admit: 2024-10-30 | Discharge: 2024-10-30 | Attending: Family | Admitting: Family

## 2024-10-30 DIAGNOSIS — Z09 Encounter for follow-up examination after completed treatment for conditions other than malignant neoplasm: Secondary | ICD-10-CM

## 2024-10-30 DIAGNOSIS — R928 Other abnormal and inconclusive findings on diagnostic imaging of breast: Secondary | ICD-10-CM | POA: Diagnosis not present

## 2024-11-02 ENCOUNTER — Ambulatory Visit: Payer: Self-pay | Admitting: Physician Assistant

## 2024-11-02 NOTE — Progress Notes (Signed)
 Name and DOB verified. Pt is aware of test results. And follow up recommendations. Interpreter services used

## 2024-11-09 ENCOUNTER — Other Ambulatory Visit: Payer: Self-pay

## 2024-12-07 ENCOUNTER — Other Ambulatory Visit: Payer: Self-pay | Admitting: Family

## 2024-12-07 ENCOUNTER — Other Ambulatory Visit: Payer: Self-pay

## 2024-12-07 DIAGNOSIS — E1165 Type 2 diabetes mellitus with hyperglycemia: Secondary | ICD-10-CM

## 2024-12-07 DIAGNOSIS — E785 Hyperlipidemia, unspecified: Secondary | ICD-10-CM

## 2024-12-07 MED ORDER — ATORVASTATIN CALCIUM 10 MG PO TABS
10.0000 mg | ORAL_TABLET | Freq: Every day | ORAL | 0 refills | Status: AC
  Filled 2024-12-07: qty 90, 90d supply, fill #0

## 2024-12-07 MED ORDER — METFORMIN HCL ER 500 MG PO TB24
500.0000 mg | ORAL_TABLET | Freq: Two times a day (BID) | ORAL | 0 refills | Status: AC
  Filled 2024-12-07: qty 180, 90d supply, fill #0

## 2024-12-08 ENCOUNTER — Other Ambulatory Visit: Payer: Self-pay

## 2024-12-30 ENCOUNTER — Other Ambulatory Visit: Payer: Self-pay

## 2025-05-27 ENCOUNTER — Encounter: Admitting: Family
